# Patient Record
Sex: Male | Born: 1970
Health system: Southern US, Community
[De-identification: ages and names within clinical notes are randomized; demographics above are authoritative.]

## PROBLEM LIST (undated history)

## (undated) HISTORY — PX: APPENDECTOMY: SHX54

---

## 2008-06-11 ENCOUNTER — Emergency Department (HOSPITAL_COMMUNITY): Admission: EM | Admit: 2008-06-11 | Discharge: 2008-06-11 | Payer: Self-pay | Admitting: Emergency Medicine

## 2008-09-14 ENCOUNTER — Emergency Department (HOSPITAL_COMMUNITY): Admission: EM | Admit: 2008-09-14 | Discharge: 2008-09-14 | Payer: Self-pay | Admitting: Emergency Medicine

## 2011-06-19 LAB — COMPREHENSIVE METABOLIC PANEL
ALT: 48
AST: 29
Alkaline Phosphatase: 66
CO2: 26
Chloride: 101
Creatinine, Ser: 1.04
GFR calc Af Amer: >60
GFR calc non Af Amer: >60
Potassium: 3.9
Total Bilirubin: 0.9

## 2011-06-19 LAB — DIFFERENTIAL
Basophils Absolute: 0
Basophils Relative: 0
Eosinophils Absolute: 0.1
Eosinophils Relative: 1
Monocytes Absolute: 0.6

## 2011-06-19 LAB — URINALYSIS, ROUTINE W REFLEX MICROSCOPIC
Bilirubin Urine: NEGATIVE
Hgb urine dipstick: NEGATIVE
Ketones, ur: >80 — AB
Nitrite: NEGATIVE
Urobilinogen, UA: 0.2

## 2011-06-19 LAB — CBC
MCV: 85.9
RBC: 5.06
WBC: 7.1

## 2011-06-19 LAB — LIPASE, BLOOD: Lipase: 22

## 2011-06-23 LAB — COMPREHENSIVE METABOLIC PANEL
AST: 24 U/L (ref 0–37)
BUN: 14 mg/dL (ref 6–23)
CO2: 25 mEq/L (ref 19–32)
Calcium: 9 mg/dL (ref 8.4–10.5)
Chloride: 102 mEq/L (ref 96–112)
Creatinine, Ser: 0.9 mg/dL (ref 0.4–1.5)
GFR calc Af Amer: >60 mL/min (ref >60)
GFR calc non Af Amer: >60 mL/min (ref >60)
Total Bilirubin: 0.7 mg/dL (ref 0.3–1.2)

## 2011-06-23 LAB — URINALYSIS, ROUTINE W REFLEX MICROSCOPIC
Glucose, UA: NEGATIVE mg/dL
Hgb urine dipstick: NEGATIVE
Ketones, ur: NEGATIVE mg/dL
Nitrite: NEGATIVE
Protein, ur: NEGATIVE mg/dL
Specific Gravity, Urine: 1.034 — ABNORMAL HIGH (ref 1.005–1.030)
Urobilinogen, UA: 0.2 mg/dL (ref 0.0–1.0)
pH: 5 (ref 5.0–8.0)

## 2011-06-23 LAB — CBC
HCT: 42.7 % (ref 39.0–52.0)
MCHC: 33.9 g/dL (ref 30.0–36.0)
MCV: 84.8 fL (ref 78.0–100.0)
Platelets: 192 10*3/uL (ref 150–400)
RBC: 5.04 MIL/uL (ref 4.22–5.81)
WBC: 7.4 10*3/uL (ref 4.0–10.5)

## 2011-06-23 LAB — DIFFERENTIAL
Basophils Absolute: 0 10*3/uL (ref 0.0–0.1)
Lymphocytes Relative: 12 % (ref 12–46)
Lymphs Abs: 0.9 10*3/uL (ref 0.7–4.0)
Neutro Abs: 6.2 10*3/uL (ref 1.7–7.7)

## 2011-06-23 LAB — LIPASE, BLOOD: Lipase: 26 U/L (ref 11–59)

## 2016-10-12 ENCOUNTER — Encounter (HOSPITAL_COMMUNITY): Payer: Self-pay | Admitting: Emergency Medicine

## 2016-10-12 ENCOUNTER — Emergency Department (HOSPITAL_COMMUNITY)
Admission: EM | Admit: 2016-10-12 | Discharge: 2016-10-13 | Disposition: A | Discharge status: Home / Self Care | Payer: Self-pay | Attending: Emergency Medicine | Admitting: Emergency Medicine

## 2016-10-12 ENCOUNTER — Emergency Department (HOSPITAL_COMMUNITY): Payer: Self-pay

## 2016-10-12 DIAGNOSIS — Y999 Unspecified external cause status: Secondary | ICD-10-CM | POA: Insufficient documentation

## 2016-10-12 DIAGNOSIS — R059 Cough, unspecified: Secondary | ICD-10-CM

## 2016-10-12 DIAGNOSIS — F17210 Nicotine dependence, cigarettes, uncomplicated: Secondary | ICD-10-CM | POA: Insufficient documentation

## 2016-10-12 DIAGNOSIS — S39011A Strain of muscle, fascia and tendon of abdomen, initial encounter: Secondary | ICD-10-CM | POA: Insufficient documentation

## 2016-10-12 DIAGNOSIS — R05 Cough: Secondary | ICD-10-CM | POA: Insufficient documentation

## 2016-10-12 DIAGNOSIS — Y939 Activity, unspecified: Secondary | ICD-10-CM | POA: Insufficient documentation

## 2016-10-12 DIAGNOSIS — T148XXA Other injury of unspecified body region, initial encounter: Secondary | ICD-10-CM

## 2016-10-12 DIAGNOSIS — Y929 Unspecified place or not applicable: Secondary | ICD-10-CM | POA: Insufficient documentation

## 2016-10-12 DIAGNOSIS — X58XXXA Exposure to other specified factors, initial encounter: Secondary | ICD-10-CM | POA: Insufficient documentation

## 2016-10-12 LAB — CBC
HCT: 44.3 % (ref 39.0–52.0)
Hemoglobin: 14.9 g/dL (ref 13.0–17.0)
MCH: 28.8 pg (ref 26.0–34.0)
MCHC: 33.6 g/dL (ref 30.0–36.0)
MCV: 85.7 fL (ref 78.0–100.0)
PLATELETS: 172 10*3/uL (ref 150–400)
RBC: 5.17 MIL/uL (ref 4.22–5.81)
RDW: 11.7 % (ref 11.5–15.5)
WBC: 4.1 10*3/uL (ref 4.0–10.5)

## 2016-10-12 LAB — BASIC METABOLIC PANEL
Anion gap: 8 (ref 5–15)
BUN: 8 mg/dL (ref 6–20)
CO2: 26 mmol/L (ref 22–32)
Calcium: 9.1 mg/dL (ref 8.9–10.3)
Chloride: 102 mmol/L (ref 101–111)
Creatinine, Ser: 0.67 mg/dL (ref 0.61–1.24)
GFR calc Af Amer: >60 mL/min (ref >60)
GLUCOSE: 129 mg/dL — AB (ref 65–99)
POTASSIUM: 4.1 mmol/L (ref 3.5–5.1)
Sodium: 136 mmol/L (ref 135–145)

## 2016-10-12 NOTE — ED Triage Notes (Signed)
Pt reports productive cough x1 month and right flank pain that began today. Pt denies fever, chills or any urinary symptoms. resp e/u, skin warm and dry.

## 2016-10-13 ENCOUNTER — Emergency Department (HOSPITAL_COMMUNITY): Payer: Self-pay

## 2016-10-13 LAB — URINALYSIS, ROUTINE W REFLEX MICROSCOPIC
BILIRUBIN URINE: NEGATIVE
Glucose, UA: NEGATIVE mg/dL
HGB URINE DIPSTICK: NEGATIVE
KETONES UR: 5 mg/dL — AB
Leukocytes, UA: NEGATIVE
Nitrite: NEGATIVE
PROTEIN: NEGATIVE mg/dL
Specific Gravity, Urine: 1.009 (ref 1.005–1.030)
pH: 5 (ref 5.0–8.0)

## 2016-10-13 MED ORDER — IBUPROFEN 600 MG PO TABS: 600.0000 mg | ORAL_TABLET | Freq: Four times a day (QID) | ORAL | 0 refills | Status: DC | PRN

## 2016-10-13 MED ORDER — CYCLOBENZAPRINE HCL 10 MG PO TABS: 10.0000 mg | ORAL_TABLET | Freq: Two times a day (BID) | ORAL | 0 refills | Status: DC | PRN

## 2016-10-13 MED ORDER — BENZONATATE 100 MG PO CAPS: 200.0000 mg | ORAL_CAPSULE | Freq: Two times a day (BID) | ORAL | 0 refills | Status: DC | PRN

## 2016-10-13 NOTE — ED Provider Notes (Signed)
[facility]-EMERGENCY DEPT Provider Note   CSN: [id number] Arrival date & time: 10/12/16  1800     History   Chief Complaint Chief Complaint  Patient presents with  . Flank Pain  . Cough    HPI Rodney Pittman is a 46 y.o. male.  Patient presents to the emergency department complaining of cough and right flank pain. Patient states that he developed sudden onset right flank pain this afternoon around 4:30 PM. He he states his symptoms started shortly after having a coughing spell. He states the pain now radiates to his right lower abdomen. He denies any associated fever, chills, nausea, vomiting, diarrhea, dysuria, or hematuria. He denies any history of kidney stones. Past surgical history is remarkable for appendectomy. He denies productive cough.   The history is provided by the patient. No language interpreter was used.    History reviewed. No pertinent past medical history.  There are no active problems to display for this patient.   Past Surgical History:  Procedure Laterality Date  . APPENDECTOMY         Home Medications    Prior to Admission medications   Not on File    Family History No family history on file.  Social History Social History  Substance Use Topics  . Smoking status: Current Some Day Smoker    Types: Cigarettes  . Smokeless tobacco: Not on file  . Alcohol use No     Allergies   Patient has no known allergies.   Review of Systems Review of Systems  Respiratory: Positive for cough.   Genitourinary: Positive for flank pain.  Musculoskeletal:       Right rib pain  All other systems reviewed and are negative.    Physical Exam Updated Vital Signs BP 138/78 (BP Location: Right Arm)   Pulse 78   Temp 98.6 F (37 C) (Oral)   Resp 20   Ht 6' (1.829 m)   Wt 81.6 kg   SpO2 99%   BMI 24.41 kg/m   Physical Exam  Constitutional: He is oriented to person, place, and time. He appears well-developed and well-nourished.  HENT:  Head:  Normocephalic and atraumatic.  Eyes: Conjunctivae and EOM are normal. Pupils are equal, round, and reactive to light. Right eye exhibits no discharge. Left eye exhibits no discharge. No scleral icterus.  Neck: Normal range of motion. Neck supple. No JVD present.  Cardiovascular: Normal rate, regular rhythm and normal heart sounds.  Exam reveals no gallop and no friction rub.   No murmur heard. Pulmonary/Chest: Effort normal and breath sounds normal. No respiratory distress. He has no wheezes. He has no rales. He exhibits no tenderness.  Abdominal: Soft. He exhibits no distension and no mass. There is no tenderness. There is no rebound and no guarding.  Musculoskeletal: Normal range of motion. He exhibits no edema or tenderness.  Neurological: He is alert and oriented to person, place, and time.  Skin: Skin is warm and dry.  Psychiatric: He has a normal mood and affect. His behavior is normal. Judgment and thought content normal.  Nursing note and vitals reviewed.    ED Treatments / Results  Labs (all labs ordered are listed, but only abnormal results are displayed) Labs Reviewed  URINALYSIS, ROUTINE W REFLEX MICROSCOPIC - Abnormal; Notable for the following:       Result Value   Ketones, ur 5 (*)    All other components within normal limits  BASIC METABOLIC PANEL - Abnormal; Notable for the following:  Glucose, Bld 129 (*)    All other components within normal limits  CBC    EKG  EKG Interpretation None       Radiology Dg Chest 2 View  Result Date: 10/12/2016 CLINICAL DATA:  Three-week history of productive cough. EXAM: CHEST  2 VIEW COMPARISON:  None. FINDINGS: The heart size and mediastinal contours are within normal limits. Both lungs are clear. The visualized skeletal structures are unremarkable. IMPRESSION: No active cardiopulmonary disease. Electronically Signed   By: Kennith Center M.D.   On: 10/12/2016 20:26    Procedures Procedures (including critical care  time)  Medications Ordered in ED Medications - No data to display   Initial Impression / Assessment and Plan / ED Course  I have reviewed the triage vital signs and the nursing notes.  Pertinent labs & imaging results that were available during my care of the patient were reviewed by me and considered in my medical decision making (see chart for details).     Patient with right flank pain. Onset after coughing today. I'm mostly suspicious of intercostal strain versus rib fracture. Chest x-ray shows no displaced ribs. Patient also has some pain that radiates into his right lower quadrant. He does have a history of appendectomy. Kidney stone is not ruled out.  CT renal study is negative for renal stone. Urinalysis negative for UTI. Will treat for intercostal strain with muscle relaxer, NSAIDs, and cough medicine.  Final Clinical Impressions(s) / ED Diagnoses   Final diagnoses:  Cough  Muscle strain    New Prescriptions New Prescriptions   BENZONATATE (TESSALON) 100 MG CAPSULE    Take 2 capsules (200 mg total) by mouth 2 (two) times daily as needed for cough.   CYCLOBENZAPRINE (FLEXERIL) 10 MG TABLET    Take 1 tablet (10 mg total) by mouth 2 (two) times daily as needed for muscle spasms.   IBUPROFEN (ADVIL,MOTRIN) 600 MG TABLET    Take 1 tablet (600 mg total) by mouth every 6 (six) hours as needed.     Roxy Horseman, PA-C 10/13/16 0129    Dione Booze, MD 10/13/16 [phone]

## 2017-02-25 ENCOUNTER — Emergency Department (HOSPITAL_COMMUNITY)
Admission: EM | Admit: 2017-02-25 | Discharge: 2017-02-25 | Disposition: A | Discharge status: Home / Self Care | Payer: Self-pay | Attending: Emergency Medicine | Admitting: Emergency Medicine

## 2017-02-25 ENCOUNTER — Encounter (HOSPITAL_COMMUNITY): Payer: Self-pay

## 2017-02-25 DIAGNOSIS — F17210 Nicotine dependence, cigarettes, uncomplicated: Secondary | ICD-10-CM | POA: Insufficient documentation

## 2017-02-25 DIAGNOSIS — Y999 Unspecified external cause status: Secondary | ICD-10-CM | POA: Insufficient documentation

## 2017-02-25 DIAGNOSIS — Y929 Unspecified place or not applicable: Secondary | ICD-10-CM | POA: Insufficient documentation

## 2017-02-25 DIAGNOSIS — Y939 Activity, unspecified: Secondary | ICD-10-CM | POA: Insufficient documentation

## 2017-02-25 DIAGNOSIS — Z23 Encounter for immunization: Secondary | ICD-10-CM | POA: Insufficient documentation

## 2017-02-25 DIAGNOSIS — S81812A Laceration without foreign body, left lower leg, initial encounter: Secondary | ICD-10-CM | POA: Insufficient documentation

## 2017-02-25 DIAGNOSIS — W268XXA Contact with other sharp object(s), not elsewhere classified, initial encounter: Secondary | ICD-10-CM | POA: Insufficient documentation

## 2017-02-25 MED ORDER — ACETAMINOPHEN 500 MG PO TABS
1000.0000 mg | ORAL_TABLET | Freq: Once | ORAL | Status: AC
  Administered 2017-02-25: 1000 mg via ORAL
  Filled 2017-02-25: qty 2

## 2017-02-25 MED ORDER — TETANUS-DIPHTH-ACELL PERTUSSIS 5-2.5-18.5 LF-MCG/0.5 IM SUSP
0.5000 mL | Freq: Once | INTRAMUSCULAR | Status: AC
  Administered 2017-02-25: 0.5 mL via INTRAMUSCULAR
  Filled 2017-02-25: qty 0.5

## 2017-02-25 MED ORDER — LIDOCAINE-EPINEPHRINE (PF) 2 %-1:200000 IJ SOLN
10.0000 mL | Freq: Once | INTRAMUSCULAR | Status: AC
  Administered 2017-02-25: 10 mL
  Filled 2017-02-25: qty 20

## 2017-02-25 NOTE — ED Notes (Signed)
Suture cart at pts bedside ?

## 2017-02-25 NOTE — ED Provider Notes (Signed)
[facility]-EMER[facility]Y DEPT Provider Note   CSN: [id number] Arrival date & time: 02/25/17  1625     History   Chief Complaint Chief Complaint  Patient presents with  . Extremity Laceration    HPI Rodney Pittman is a 46 y.o. male.   Laceration   The incident occurred less than 1 hour ago. Pain location: L inner thigh. The laceration is 10 cm in size. Injury mechanism: Ceramic tile. The pain is at a severity of 5/10. The pain is moderate. The pain has been constant since onset. He reports no foreign bodies present. His tetanus status is unknown.    History reviewed. No pertinent past medical history.  There are no active problems to display for this patient.   Past Surgical History:  Procedure Laterality Date  . APPENDECTOMY         Home Medications    Prior to Admission medications   Medication Sig Start Date End Date Taking? Authorizing Provider  benzonatate (TESSALON) 100 MG capsule Take 2 capsules (200 mg total) by mouth 2 (two) times daily as needed for cough. 10/13/16   Roxy HorsemanBrowning, Robert, PA-C  cyclobenzaprine (FLEXERIL) 10 MG tablet Take 1 tablet (10 mg total) by mouth 2 (two) times daily as needed for muscle spasms. 10/13/16   Roxy HorsemanBrowning, Robert, PA-C  ibuprofen (ADVIL,MOTRIN) 600 MG tablet Take 1 tablet (600 mg total) by mouth every 6 (six) hours as needed. 10/13/16   Roxy HorsemanBrowning, Robert, PA-C    Family History No family history on file.  Social History Social History  Substance Use Topics  . Smoking status: Current Some Day Smoker    Types: Cigarettes  . Smokeless tobacco: Never Used  . Alcohol use No     Allergies   Patient has no known allergies.   Review of Systems Review of Systems  Respiratory: Negative for shortness of breath.   Cardiovascular: Negative for chest pain.  Gastrointestinal: Negative for nausea and vomiting.  Musculoskeletal: Negative for gait problem.  Skin: Positive for wound.  Neurological: Negative for weakness and numbness.    Psychiatric/Behavioral: Negative for behavioral problems.     Physical Exam Updated Vital Signs BP 132/74 (BP Location: Left Arm)   Pulse 91   Temp 98.2 F (36.8 C) (Oral)   Resp 18   SpO2 97%   Physical Exam  Constitutional: He appears distressed.  HENT:  Head: Atraumatic.  Eyes: Conjunctivae are normal.  Neck: Normal range of motion.  Cardiovascular: Normal rate.   Pulmonary/Chest: Effort normal.  Abdominal: He exhibits no distension.  Musculoskeletal: Normal range of motion.  No deformity to the left leg. Motor function intact. Sensation intact. +2 DP pulse  Neurological: He is alert.  Skin: Skin is warm.  10 cm laceration to left inner thigh. Hemostatic.   Psychiatric: He has a normal mood and affect.     ED Treatments / Results  Labs (all labs ordered are listed, but only abnormal results are displayed) Labs Reviewed - No data to display  EKG  EKG Interpretation None       Radiology No results found.  Procedures Procedures (including critical care time)  Medications Ordered in ED Medications  Tdap (BOOSTRIX) injection 0.5 mL (0.5 mLs Intramuscular Given 02/25/17 1657)  lidocaine-EPINEPHrine (XYLOCAINE W/EPI) 2 %-1:200000 (PF) injection 10 mL (10 mLs Infiltration Given 02/25/17 1656)  acetaminophen (TYLENOL) tablet 1,000 mg (1,000 mg Oral Given 02/25/17 1746)     Initial Impression / Assessment and Plan / ED Course  I have reviewed the triage vital  signs and the nursing notes.  Pertinent labs & imaging results that were available during my care of the patient were reviewed by me and considered in my medical decision making (see chart for details).    46 year old male in no significant past medical history presents with a left lower leg laceration after dropping ceramic tile. Neurovascularly intact. Bleeding well controlled on arrival. No signs of arterial bleed. Tetanus updated. Wound was washed out, laceration was repaired. See procedure note above  for details. Given NSAIDs for pain.  Discuss follow-up as an outpatient for suture removal in 7 days.Given strict instruction precautions for signs of infection. Patient expressed understanding, questions or concerns at time of discharge.  Final Clinical Impressions(s) / ED Diagnoses   Final diagnoses:  Laceration of left lower extremity, initial encounter    New Prescriptions New Prescriptions   No medications on file     Corena Herter, MD 02/25/17 1754    Derwood Kaplan, MD 02/28/17 1836

## 2017-02-25 NOTE — ED Triage Notes (Signed)
Patient here with laceration to left lower leg after dropping a piece of ceramic on same. Arrived with sheet type tourniquet due to heavy bleeding at home per family. Patient alert and oriented, no bleeding on arrival

## 2017-12-09 ENCOUNTER — Encounter (HOSPITAL_COMMUNITY): Payer: Self-pay | Admitting: Emergency Medicine

## 2017-12-09 ENCOUNTER — Emergency Department (HOSPITAL_COMMUNITY): Payer: Self-pay

## 2017-12-09 ENCOUNTER — Other Ambulatory Visit: Payer: Self-pay

## 2017-12-09 DIAGNOSIS — R079 Chest pain, unspecified: Secondary | ICD-10-CM | POA: Insufficient documentation

## 2017-12-09 DIAGNOSIS — Z5321 Procedure and treatment not carried out due to patient leaving prior to being seen by health care provider: Secondary | ICD-10-CM | POA: Insufficient documentation

## 2017-12-09 LAB — BASIC METABOLIC PANEL
ANION GAP: 10 (ref 5–15)
BUN: 9 mg/dL (ref 6–20)
CALCIUM: 9.2 mg/dL (ref 8.9–10.3)
CHLORIDE: 104 mmol/L (ref 101–111)
CO2: 24 mmol/L (ref 22–32)
Creatinine, Ser: 0.74 mg/dL (ref 0.61–1.24)
GFR calc non Af Amer: >60 mL/min (ref >60)
Glucose, Bld: 148 mg/dL — ABNORMAL HIGH (ref 65–99)
Potassium: 3.7 mmol/L (ref 3.5–5.1)
SODIUM: 138 mmol/L (ref 135–145)

## 2017-12-09 LAB — CBC
HCT: 43.2 % (ref 39.0–52.0)
HEMOGLOBIN: 14.9 g/dL (ref 13.0–17.0)
MCH: 29.6 pg (ref 26.0–34.0)
MCHC: 34.5 g/dL (ref 30.0–36.0)
MCV: 85.7 fL (ref 78.0–100.0)
Platelets: 196 10*3/uL (ref 150–400)
RBC: 5.04 MIL/uL (ref 4.22–5.81)
RDW: 11.7 % (ref 11.5–15.5)
WBC: 6.9 10*3/uL (ref 4.0–10.5)

## 2017-12-09 NOTE — ED Triage Notes (Signed)
Pt reports left sided sharp/pressure pain for an hour.  Pt states no hx of chest pain/cardiac issues.  Pt was dizzy and "seeing stars."  No diaphoresis or emesis however was nauseated.

## 2017-12-10 ENCOUNTER — Emergency Department (HOSPITAL_COMMUNITY)
Admission: EM | Admit: 2017-12-10 | Discharge: 2017-12-10 | Disposition: A | Discharge status: Home / Self Care | Payer: Self-pay | Attending: Emergency Medicine | Admitting: Emergency Medicine

## 2017-12-10 LAB — I-STAT TROPONIN, ED: Troponin i, poc: 0 ng/mL (ref 0.00–0.08)

## 2017-12-10 NOTE — ED Notes (Signed)
Pt has left the lobby. 

## 2018-06-15 IMAGING — DX DG CHEST 2V
2 series · 2 of 2 positions shown · non-contrast
Comparison: 10/12/2016

CLINICAL DATA: Chest pain with shortness of breath.

EXAM:
CHEST - 2 VIEW

[w chest pa]
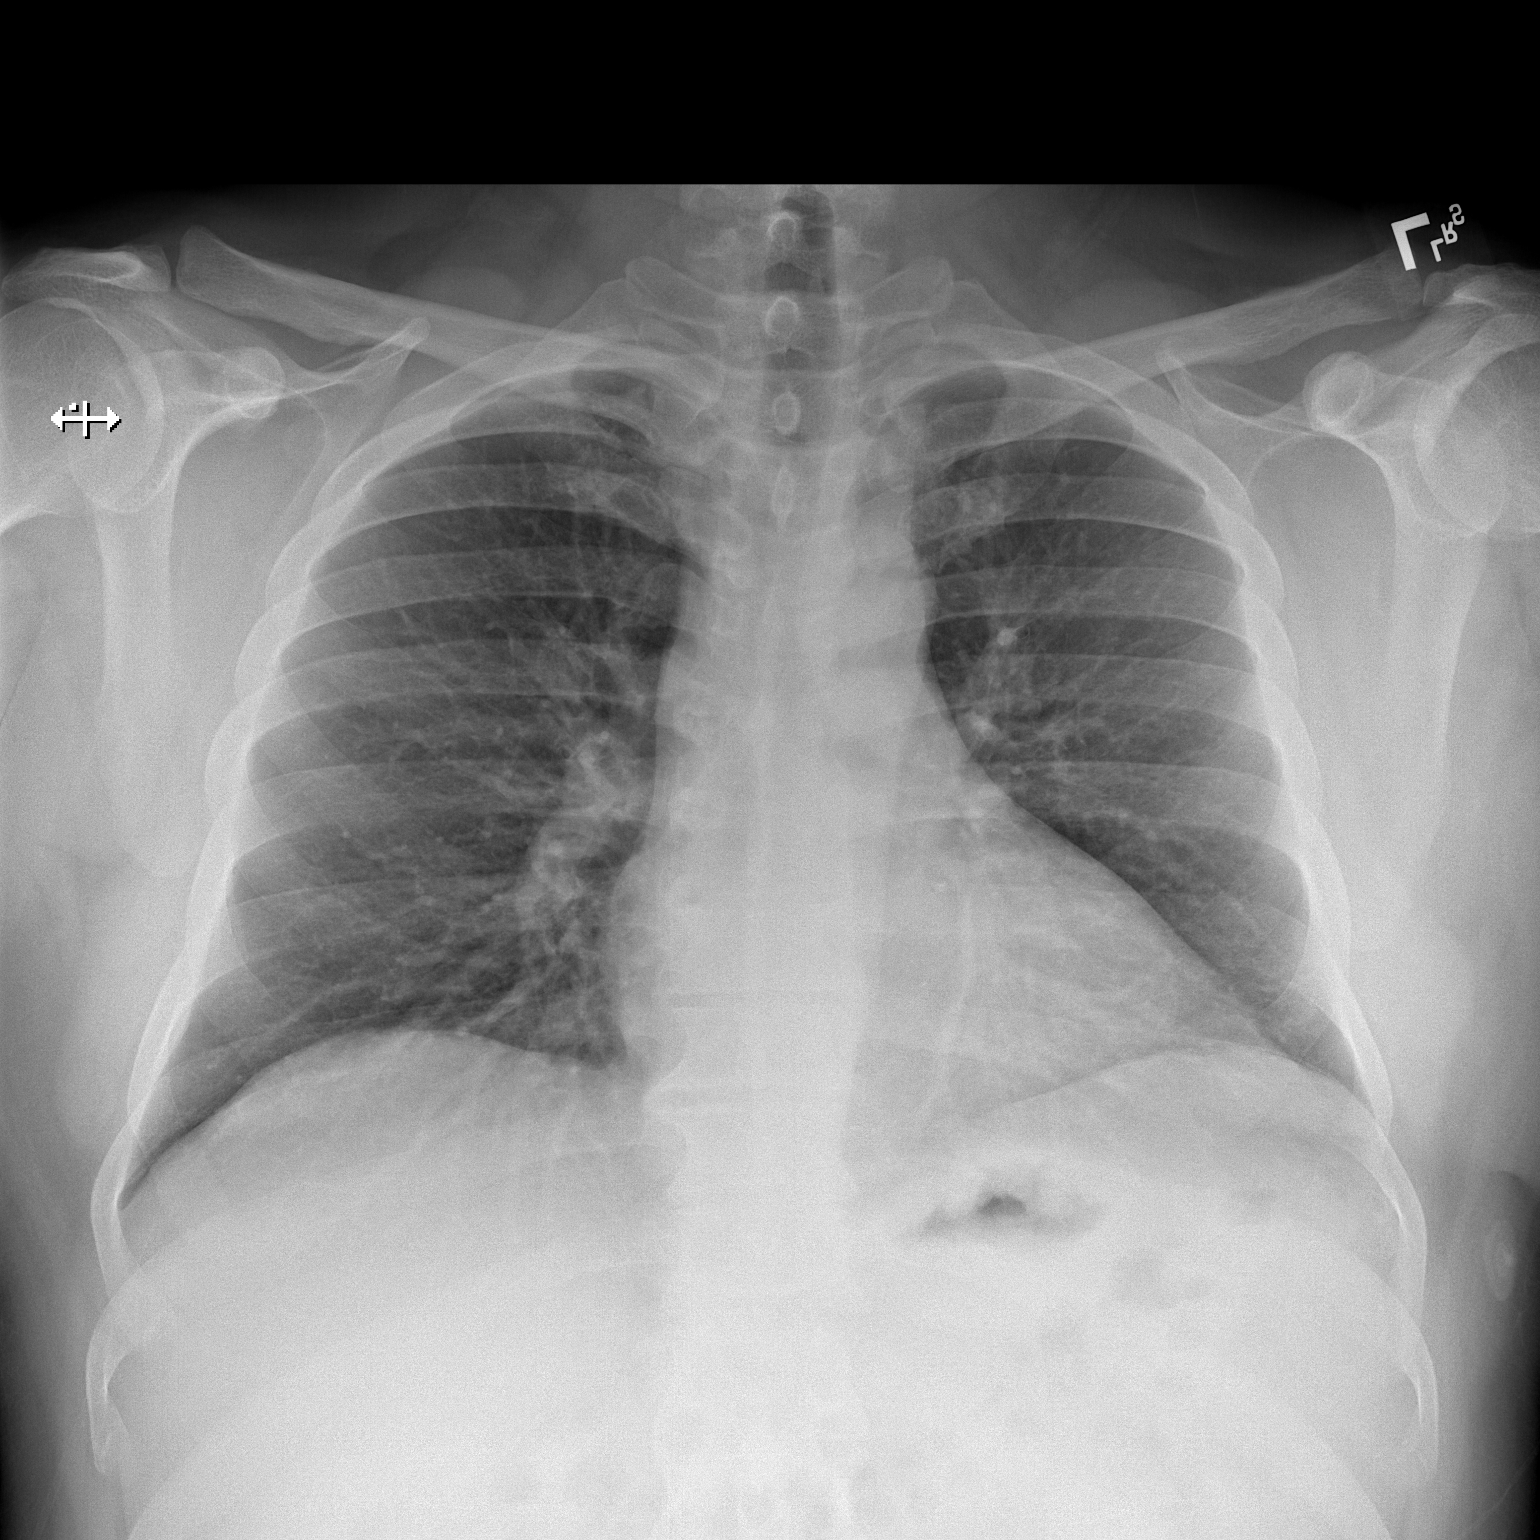

[w chest lat]
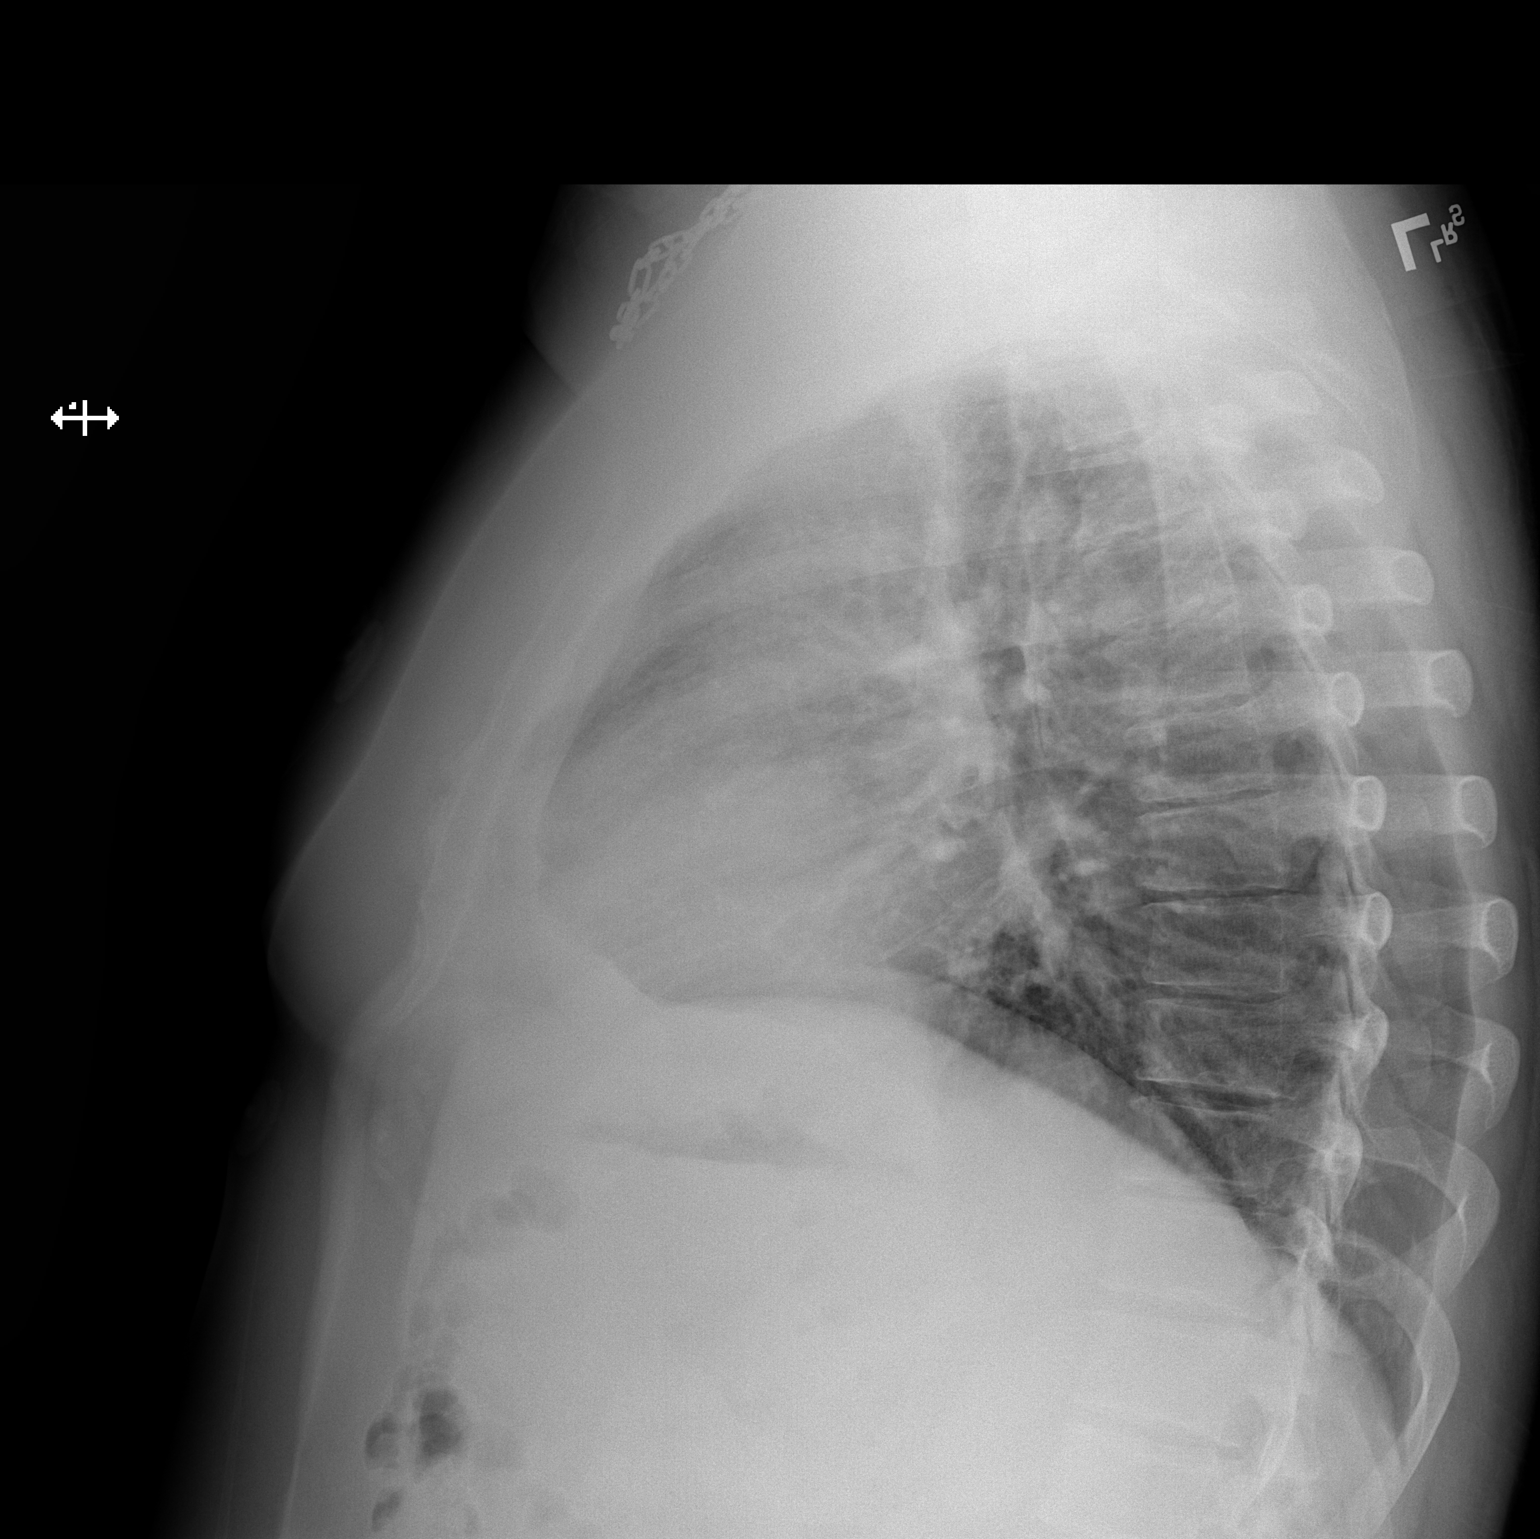

[2 of 2 positions shown; findings below may reference images not displayed]

FINDINGS: The lungs are clear without focal pneumonia, edema, pneumothorax or
pleural effusion. The cardiopericardial silhouette is within normal
limits for size. The visualized bony structures of the thorax are
intact.
IMPRESSION: No active cardiopulmonary disease.

## 2018-08-16 ENCOUNTER — Encounter (HOSPITAL_COMMUNITY): Payer: Self-pay

## 2018-08-16 ENCOUNTER — Emergency Department (HOSPITAL_COMMUNITY)
Admission: EM | Admit: 2018-08-16 | Discharge: 2018-08-16 | Discharge status: Against Medical Advice | Payer: Self-pay | Attending: Emergency Medicine | Admitting: Emergency Medicine

## 2018-08-16 ENCOUNTER — Other Ambulatory Visit: Payer: Self-pay

## 2018-08-16 DIAGNOSIS — R203 Hyperesthesia: Secondary | ICD-10-CM | POA: Insufficient documentation

## 2018-08-16 NOTE — ED Notes (Signed)
Pt called x2, No answer.

## 2018-08-16 NOTE — ED Notes (Signed)
Pt not found in lobby 

## 2018-08-16 NOTE — ED Notes (Signed)
Pt not found in the lobby 

## 2018-08-16 NOTE — ED Notes (Signed)
Pt called for room x1. No answer.  

## 2018-08-16 NOTE — ED Triage Notes (Signed)
Patient c/o feeling sleepy and feeling like ants are crawling on his arms and legs since this AM

## 2019-05-17 ENCOUNTER — Emergency Department (HOSPITAL_COMMUNITY)
Admission: EM | Admit: 2019-05-17 | Discharge: 2019-05-17 | Disposition: A | Discharge status: Home / Self Care | Payer: No Typology Code available for payment source | Attending: Emergency Medicine | Admitting: Emergency Medicine

## 2019-05-17 ENCOUNTER — Encounter (HOSPITAL_COMMUNITY): Payer: Self-pay | Admitting: Obstetrics and Gynecology

## 2019-05-17 ENCOUNTER — Other Ambulatory Visit: Payer: Self-pay

## 2019-05-17 ENCOUNTER — Emergency Department (HOSPITAL_COMMUNITY): Payer: No Typology Code available for payment source

## 2019-05-17 DIAGNOSIS — F17210 Nicotine dependence, cigarettes, uncomplicated: Secondary | ICD-10-CM | POA: Diagnosis not present

## 2019-05-17 DIAGNOSIS — Y9389 Activity, other specified: Secondary | ICD-10-CM | POA: Diagnosis not present

## 2019-05-17 DIAGNOSIS — Y929 Unspecified place or not applicable: Secondary | ICD-10-CM | POA: Insufficient documentation

## 2019-05-17 DIAGNOSIS — Y999 Unspecified external cause status: Secondary | ICD-10-CM | POA: Diagnosis not present

## 2019-05-17 DIAGNOSIS — M25512 Pain in left shoulder: Secondary | ICD-10-CM

## 2019-05-17 DIAGNOSIS — M25562 Pain in left knee: Secondary | ICD-10-CM | POA: Insufficient documentation

## 2019-05-17 MED ORDER — NAPROXEN 500 MG PO TABS: 500.0000 mg | ORAL_TABLET | Freq: Two times a day (BID) | ORAL | 0 refills | Status: AC

## 2019-05-17 MED ORDER — KETOROLAC TROMETHAMINE 30 MG/ML IJ SOLN
30.0000 mg | Freq: Once | INTRAMUSCULAR | Status: AC
  Administered 2019-05-17: 22:00:00 30 mg via INTRAMUSCULAR
  Filled 2019-05-17: qty 1

## 2019-05-17 MED ORDER — METHOCARBAMOL 500 MG PO TABS: 500.0000 mg | ORAL_TABLET | Freq: Two times a day (BID) | ORAL | 0 refills | Status: AC

## 2019-05-17 NOTE — Discharge Instructions (Signed)
Los resultados de sus rayos X fueron negativos hoy.  Le he recetado medicina para ayudar con el dolor y con la inflamacion.   Methocarbamol es una medicina que va ayudar a relajar sus musculos, no tome alcohol con esta medicina ni maneje, ya que puede causar [place].  Naproxen es para la inflamacion, puede tomar dos diarias con comida.
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
## 2019-05-17 NOTE — ED Notes (Signed)
An After Visit Summary was printed and given to the patient. Discharge instructions given and no further questions at this time.  

## 2019-10-21 ENCOUNTER — Ambulatory Visit: Payer: No Typology Code available for payment source | Attending: Internal Medicine

## 2019-10-21 DIAGNOSIS — Z20822 Contact with and (suspected) exposure to covid-19: Secondary | ICD-10-CM

## 2019-10-22 LAB — NOVEL CORONAVIRUS, NAA: SARS-CoV-2, NAA: NOT DETECTED

## 2020-06-07 ENCOUNTER — Emergency Department (HOSPITAL_COMMUNITY): Payer: Self-pay

## 2020-06-07 ENCOUNTER — Emergency Department (HOSPITAL_COMMUNITY): Payer: Self-pay | Admitting: Anesthesiology

## 2020-06-07 ENCOUNTER — Inpatient Hospital Stay (HOSPITAL_COMMUNITY)
Admission: EM | Admit: 2020-06-07 | Discharge: 2020-06-10 | DRG: 480 | Disposition: A | Discharge status: Home Health | Payer: Self-pay | Attending: Orthopedic Surgery | Admitting: Orthopedic Surgery

## 2020-06-07 ENCOUNTER — Encounter (HOSPITAL_COMMUNITY)
Admission: EM | Disposition: A | Discharge status: Home Health | Payer: Self-pay | Source: Home / Self Care | Attending: Orthopedic Surgery

## 2020-06-07 ENCOUNTER — Other Ambulatory Visit: Payer: Self-pay

## 2020-06-07 DIAGNOSIS — Y990 Civilian activity done for income or pay: Secondary | ICD-10-CM

## 2020-06-07 DIAGNOSIS — S82042C Displaced comminuted fracture of left patella, initial encounter for open fracture type IIIA, IIIB, or IIIC: Principal | ICD-10-CM | POA: Diagnosis present

## 2020-06-07 DIAGNOSIS — W1789XA Other fall from one level to another, initial encounter: Secondary | ICD-10-CM

## 2020-06-07 DIAGNOSIS — S72432C Displaced fracture of medial condyle of left femur, initial encounter for open fracture type IIIA, IIIB, or IIIC: Secondary | ICD-10-CM | POA: Diagnosis present

## 2020-06-07 DIAGNOSIS — F17210 Nicotine dependence, cigarettes, uncomplicated: Secondary | ICD-10-CM | POA: Diagnosis present

## 2020-06-07 DIAGNOSIS — W11XXXA Fall on and from ladder, initial encounter: Secondary | ICD-10-CM | POA: Diagnosis present

## 2020-06-07 DIAGNOSIS — S81009A Unspecified open wound, unspecified knee, initial encounter: Secondary | ICD-10-CM

## 2020-06-07 DIAGNOSIS — T1490XA Injury, unspecified, initial encounter: Secondary | ICD-10-CM

## 2020-06-07 DIAGNOSIS — M898X9 Other specified disorders of bone, unspecified site: Secondary | ICD-10-CM | POA: Diagnosis present

## 2020-06-07 DIAGNOSIS — S40811A Abrasion of right upper arm, initial encounter: Secondary | ICD-10-CM | POA: Diagnosis present

## 2020-06-07 DIAGNOSIS — E119 Type 2 diabetes mellitus without complications: Secondary | ICD-10-CM | POA: Diagnosis present

## 2020-06-07 DIAGNOSIS — S82042B Displaced comminuted fracture of left patella, initial encounter for open fracture type I or II: Secondary | ICD-10-CM | POA: Diagnosis present

## 2020-06-07 DIAGNOSIS — Z419 Encounter for procedure for purposes other than remedying health state, unspecified: Secondary | ICD-10-CM

## 2020-06-07 DIAGNOSIS — F17200 Nicotine dependence, unspecified, uncomplicated: Secondary | ICD-10-CM | POA: Diagnosis present

## 2020-06-07 DIAGNOSIS — Z20822 Contact with and (suspected) exposure to covid-19: Secondary | ICD-10-CM | POA: Diagnosis present

## 2020-06-07 DIAGNOSIS — S8261XA Displaced fracture of lateral malleolus of right fibula, initial encounter for closed fracture: Secondary | ICD-10-CM | POA: Diagnosis present

## 2020-06-07 DIAGNOSIS — W19XXXA Unspecified fall, initial encounter: Secondary | ICD-10-CM

## 2020-06-07 DIAGNOSIS — S82092C Other fracture of left patella, initial encounter for open fracture type IIIA, IIIB, or IIIC: Secondary | ICD-10-CM

## 2020-06-07 DIAGNOSIS — T148XXA Other injury of unspecified body region, initial encounter: Secondary | ICD-10-CM

## 2020-06-07 DIAGNOSIS — E139 Other specified diabetes mellitus without complications: Secondary | ICD-10-CM

## 2020-06-07 DIAGNOSIS — D62 Acute posthemorrhagic anemia: Secondary | ICD-10-CM | POA: Diagnosis not present

## 2020-06-07 DIAGNOSIS — Y9289 Other specified places as the place of occurrence of the external cause: Secondary | ICD-10-CM

## 2020-06-07 DIAGNOSIS — E0800 Diabetes mellitus due to underlying condition with hyperosmolarity without nonketotic hyperglycemic-hyperosmolar coma (NKHHC): Secondary | ICD-10-CM

## 2020-06-07 DIAGNOSIS — Y9389 Activity, other specified: Secondary | ICD-10-CM

## 2020-06-07 HISTORY — PX: IRRIGATION AND DEBRIDEMENT KNEE: SHX5185

## 2020-06-07 HISTORY — PX: ORIF PATELLA: SHX5033

## 2020-06-07 HISTORY — PX: APPLICATION OF WOUND VAC: SHX5189

## 2020-06-07 HISTORY — PX: I & D EXTREMITY: SHX5045

## 2020-06-07 HISTORY — DX: Unspecified fall, initial encounter: W19.XXXA

## 2020-06-07 LAB — COMPREHENSIVE METABOLIC PANEL
ALT: 46 U/L — ABNORMAL HIGH (ref 0–44)
AST: 26 U/L (ref 15–41)
Albumin: 3.6 g/dL (ref 3.5–5.0)
Alkaline Phosphatase: 54 U/L (ref 38–126)
Anion gap: 9 (ref 5–15)
BUN: 9 mg/dL (ref 6–20)
CO2: 26 mmol/L (ref 22–32)
Calcium: 8.9 mg/dL (ref 8.9–10.3)
Chloride: 104 mmol/L (ref 98–111)
Creatinine, Ser: 0.84 mg/dL (ref 0.61–1.24)
GFR calc Af Amer: >60 mL/min (ref >60)
GFR calc non Af Amer: >60 mL/min (ref >60)
Glucose, Bld: 161 mg/dL — ABNORMAL HIGH (ref 70–99)
Potassium: 3.8 mmol/L (ref 3.5–5.1)
Sodium: 139 mmol/L (ref 135–145)
Total Bilirubin: 0.6 mg/dL (ref 0.3–1.2)
Total Protein: 6.8 g/dL (ref 6.5–8.1)

## 2020-06-07 LAB — I-STAT CHEM 8, ED
BUN: 11 mg/dL (ref 6–20)
Calcium, Ion: 1.22 mmol/L (ref 1.15–1.40)
Chloride: 102 mmol/L (ref 98–111)
Creatinine, Ser: 0.7 mg/dL (ref 0.61–1.24)
Glucose, Bld: 155 mg/dL — ABNORMAL HIGH (ref 70–99)
HCT: 39 % (ref 39.0–52.0)
Hemoglobin: 13.3 g/dL (ref 13.0–17.0)
Potassium: 3.8 mmol/L (ref 3.5–5.1)
Sodium: 141 mmol/L (ref 135–145)
TCO2: 25 mmol/L (ref 22–32)

## 2020-06-07 LAB — CBC
HCT: 41.7 % (ref 39.0–52.0)
Hemoglobin: 13.6 g/dL (ref 13.0–17.0)
MCH: 28.2 pg (ref 26.0–34.0)
MCHC: 32.6 g/dL (ref 30.0–36.0)
MCV: 86.5 fL (ref 80.0–100.0)
Platelets: 207 10*3/uL (ref 150–400)
RBC: 4.82 MIL/uL (ref 4.22–5.81)
RDW: 11.1 % — ABNORMAL LOW (ref 11.5–15.5)
WBC: 8.3 10*3/uL (ref 4.0–10.5)
nRBC: 0 % (ref 0.0–0.2)

## 2020-06-07 LAB — SARS CORONAVIRUS 2 BY RT PCR (HOSPITAL ORDER, PERFORMED IN ~~LOC~~ HOSPITAL LAB): SARS Coronavirus 2: NEGATIVE

## 2020-06-07 LAB — SAMPLE TO BLOOD BANK

## 2020-06-07 LAB — PROTIME-INR
INR: 1 (ref 0.8–1.2)
Prothrombin Time: 13.2 seconds (ref 11.4–15.2)

## 2020-06-07 LAB — LACTIC ACID, PLASMA: Lactic Acid, Venous: 2 mmol/L (ref 0.5–1.9)

## 2020-06-07 LAB — ETHANOL: Alcohol, Ethyl (B): <10 mg/dL (ref <10)

## 2020-06-07 SURGERY — IRRIGATION AND DEBRIDEMENT EXTREMITY
Anesthesia: General | Site: Knee | Laterality: Left

## 2020-06-07 MED ORDER — ONDANSETRON HCL 4 MG/2ML IJ SOLN
INTRAMUSCULAR | Status: DC | PRN
  Administered 2020-06-07: 4 mg via INTRAVENOUS

## 2020-06-07 MED ORDER — ACETAMINOPHEN 10 MG/ML IV SOLN
INTRAVENOUS | Status: AC
  Filled 2020-06-07: qty 100

## 2020-06-07 MED ORDER — FENTANYL CITRATE (PF) 250 MCG/5ML IJ SOLN
INTRAMUSCULAR | Status: AC
  Filled 2020-06-07: qty 5

## 2020-06-07 MED ORDER — LACTATED RINGERS IV SOLN: INTRAVENOUS | Status: DC | PRN

## 2020-06-07 MED ORDER — ROCURONIUM BROMIDE 100 MG/10ML IV SOLN
INTRAVENOUS | Status: DC | PRN
  Administered 2020-06-07: 20 mg via INTRAVENOUS

## 2020-06-07 MED ORDER — IOHEXOL 300 MG/ML  SOLN
100.0000 mL | Freq: Once | INTRAMUSCULAR | Status: AC | PRN
  Administered 2020-06-07: 100 mL via INTRAVENOUS

## 2020-06-07 MED ORDER — LIDOCAINE 2% (20 MG/ML) 5 ML SYRINGE
INTRAMUSCULAR | Status: AC
  Filled 2020-06-07: qty 5

## 2020-06-07 MED ORDER — ROCURONIUM BROMIDE 10 MG/ML (PF) SYRINGE
PREFILLED_SYRINGE | INTRAVENOUS | Status: AC
  Filled 2020-06-07: qty 10

## 2020-06-07 MED ORDER — PROPOFOL 10 MG/ML IV BOLUS
INTRAVENOUS | Status: DC | PRN
  Administered 2020-06-07: 200 mg via INTRAVENOUS

## 2020-06-07 MED ORDER — ACETAMINOPHEN 10 MG/ML IV SOLN
INTRAVENOUS | Status: DC | PRN
  Administered 2020-06-07: 1000 mg via INTRAVENOUS

## 2020-06-07 MED ORDER — PHENYLEPHRINE 40 MCG/ML (10ML) SYRINGE FOR IV PUSH (FOR BLOOD PRESSURE SUPPORT)
PREFILLED_SYRINGE | INTRAVENOUS | Status: AC
  Filled 2020-06-07: qty 10

## 2020-06-07 MED ORDER — VANCOMYCIN HCL IN DEXTROSE 1-5 GM/200ML-% IV SOLN
1000.0000 mg | Freq: Once | INTRAVENOUS | Status: AC
  Administered 2020-06-07: 1000 mg via INTRAVENOUS
  Filled 2020-06-07: qty 200

## 2020-06-07 MED ORDER — ALBUMIN HUMAN 5 % IV SOLN: INTRAVENOUS | Status: DC | PRN

## 2020-06-07 MED ORDER — PROPOFOL 10 MG/ML IV BOLUS
INTRAVENOUS | Status: AC
  Filled 2020-06-07: qty 20

## 2020-06-07 MED ORDER — SUCCINYLCHOLINE CHLORIDE 20 MG/ML IJ SOLN
INTRAMUSCULAR | Status: DC | PRN
  Administered 2020-06-07: 100 mg via INTRAVENOUS

## 2020-06-07 MED ORDER — DEXAMETHASONE SODIUM PHOSPHATE 10 MG/ML IJ SOLN
INTRAMUSCULAR | Status: AC
  Filled 2020-06-07: qty 1

## 2020-06-07 MED ORDER — SODIUM CHLORIDE 0.9 % IV SOLN
2.0000 g | Freq: Once | INTRAVENOUS | Status: AC
  Administered 2020-06-07: 2 g via INTRAVENOUS
  Filled 2020-06-07: qty 20

## 2020-06-07 MED ORDER — SUCCINYLCHOLINE CHLORIDE 200 MG/10ML IV SOSY
PREFILLED_SYRINGE | INTRAVENOUS | Status: AC
  Filled 2020-06-07: qty 10

## 2020-06-07 MED ORDER — SODIUM CHLORIDE 0.9 % IR SOLN
Status: DC | PRN
  Administered 2020-06-07: 9000 mL

## 2020-06-07 MED ORDER — MIDAZOLAM HCL 5 MG/5ML IJ SOLN
INTRAMUSCULAR | Status: DC | PRN
  Administered 2020-06-07: 2 mg via INTRAVENOUS

## 2020-06-07 MED ORDER — MIDAZOLAM HCL 2 MG/2ML IJ SOLN
INTRAMUSCULAR | Status: AC
  Filled 2020-06-07: qty 2

## 2020-06-07 MED ORDER — ONDANSETRON HCL 4 MG/2ML IJ SOLN
INTRAMUSCULAR | Status: AC
  Filled 2020-06-07: qty 2

## 2020-06-07 MED ORDER — PHENYLEPHRINE HCL (PRESSORS) 10 MG/ML IV SOLN
INTRAVENOUS | Status: DC | PRN
  Administered 2020-06-07 (×2): 40 ug via INTRAVENOUS

## 2020-06-07 MED ORDER — LIDOCAINE HCL (CARDIAC) PF 100 MG/5ML IV SOSY
PREFILLED_SYRINGE | INTRAVENOUS | Status: DC | PRN
  Administered 2020-06-07: 60 mg via INTRATRACHEAL

## 2020-06-07 MED ORDER — 0.9 % SODIUM CHLORIDE (POUR BTL) OPTIME
TOPICAL | Status: DC | PRN
  Administered 2020-06-07: 1000 mL

## 2020-06-07 MED ORDER — MORPHINE SULFATE (PF) 4 MG/ML IV SOLN
4.0000 mg | Freq: Once | INTRAVENOUS | Status: AC
  Administered 2020-06-07: 4 mg via INTRAVENOUS
  Filled 2020-06-07: qty 1

## 2020-06-07 MED ORDER — FENTANYL CITRATE (PF) 250 MCG/5ML IJ SOLN
INTRAMUSCULAR | Status: DC | PRN
  Administered 2020-06-07 (×2): 50 ug via INTRAVENOUS
  Administered 2020-06-07: 100 ug via INTRAVENOUS
  Administered 2020-06-07: 50 ug via INTRAVENOUS
  Administered 2020-06-07: 100 ug via INTRAVENOUS

## 2020-06-07 MED ORDER — EPHEDRINE 5 MG/ML INJ
INTRAVENOUS | Status: AC
  Filled 2020-06-07: qty 10

## 2020-06-07 SURGICAL SUPPLY — 61 items
BIT DRILL 2.4 AO COUPLING CANN (BIT) ×4 IMPLANT
BNDG COHESIVE 4X5 TAN STRL (GAUZE/BANDAGES/DRESSINGS) ×4 IMPLANT
BNDG ELASTIC 4X5.8 VLCR STR LF (GAUZE/BANDAGES/DRESSINGS) ×4 IMPLANT
BNDG ELASTIC 6X10 VLCR STRL LF (GAUZE/BANDAGES/DRESSINGS) ×4 IMPLANT
BNDG GAUZE ELAST 4 BULKY (GAUZE/BANDAGES/DRESSINGS) ×4 IMPLANT
BRUSH SCRUB EZ PLAIN DRY (MISCELLANEOUS) ×8 IMPLANT
CANISTER WOUND CARE 500ML ATS (WOUND CARE) ×4 IMPLANT
COVER SURGICAL LIGHT HANDLE (MISCELLANEOUS) ×8 IMPLANT
COVER WAND RF STERILE (DRAPES) IMPLANT
DRAIN PENROSE 1/4X12 LTX STRL (WOUND CARE) ×4 IMPLANT
DRAPE U-SHAPE 47X51 STRL (DRAPES) ×4 IMPLANT
DRSG ADAPTIC 3X8 NADH LF (GAUZE/BANDAGES/DRESSINGS) ×4 IMPLANT
DRSG MEPILEX BORDER 4X8 (GAUZE/BANDAGES/DRESSINGS) ×4 IMPLANT
DRSG VAC ATS MED SENSATRAC (GAUZE/BANDAGES/DRESSINGS) ×4 IMPLANT
ELECT REM PT RETURN 9FT ADLT (ELECTROSURGICAL)
ELECTRODE REM PT RTRN 9FT ADLT (ELECTROSURGICAL) IMPLANT
GAUZE SPONGE 4X4 12PLY STRL (GAUZE/BANDAGES/DRESSINGS) ×4 IMPLANT
GLOVE BIO SURGEON STRL SZ7.5 (GLOVE) ×4 IMPLANT
GLOVE BIO SURGEON STRL SZ8 (GLOVE) ×4 IMPLANT
GLOVE BIOGEL PI IND STRL 7.5 (GLOVE) ×2 IMPLANT
GLOVE BIOGEL PI IND STRL 8 (GLOVE) ×2 IMPLANT
GLOVE BIOGEL PI IND STRL 9 (GLOVE) ×2 IMPLANT
GLOVE BIOGEL PI INDICATOR 7.5 (GLOVE) ×2
GLOVE BIOGEL PI INDICATOR 8 (GLOVE) ×2
GLOVE BIOGEL PI INDICATOR 9 (GLOVE) ×2
GOWN STRL REUS W/ TWL LRG LVL3 (GOWN DISPOSABLE) ×4 IMPLANT
GOWN STRL REUS W/ TWL XL LVL3 (GOWN DISPOSABLE) ×2 IMPLANT
GOWN STRL REUS W/TWL LRG LVL3 (GOWN DISPOSABLE) ×4
GOWN STRL REUS W/TWL XL LVL3 (GOWN DISPOSABLE) ×2
HANDPIECE INTERPULSE COAX TIP (DISPOSABLE)
IMMOBILIZER KNEE 22 UNIV (SOFTGOODS) ×4 IMPLANT
K-WIRE TROC 1.25X150 (WIRE) ×12
KIT BASIN OR (CUSTOM PROCEDURE TRAY) ×4 IMPLANT
KIT TURNOVER KIT B (KITS) ×4 IMPLANT
KWIRE TROC 1.25X150 (WIRE) ×6 IMPLANT
MANIFOLD NEPTUNE II (INSTRUMENTS) ×4 IMPLANT
NS IRRIG 1000ML POUR BTL (IV SOLUTION) ×4 IMPLANT
PACK ORTHO EXTREMITY (CUSTOM PROCEDURE TRAY) ×4 IMPLANT
PAD ARMBOARD 7.5X6 YLW CONV (MISCELLANEOUS) ×8 IMPLANT
PADDING CAST COTTON 6X4 STRL (CAST SUPPLIES) ×4 IMPLANT
RETRIEVER SUT HEWSON (MISCELLANEOUS) ×4 IMPLANT
SCREW CANN PT 4.0X34 (Screw) ×8 IMPLANT
SET HNDPC FAN SPRY TIP SCT (DISPOSABLE) IMPLANT
SOL PREP POV-IOD 4OZ 10% (MISCELLANEOUS) ×4 IMPLANT
SOL PREP PROV IODINE SCRUB 4OZ (MISCELLANEOUS) ×4 IMPLANT
SPONGE LAP 18X18 RF (DISPOSABLE) ×4 IMPLANT
STOCKINETTE IMPERVIOUS 9X36 MD (GAUZE/BANDAGES/DRESSINGS) IMPLANT
SUT ETHILON 2 0 PSLX (SUTURE) ×8 IMPLANT
SUT FIBERWIRE #2 38 T-5 BLUE (SUTURE) ×12
SUT PDS AB 1 CT1 36 (SUTURE) ×8 IMPLANT
SUT PDS AB 2-0 CT1 27 (SUTURE) ×4 IMPLANT
SUT PROLENE 3 0 PS 1 (SUTURE) ×12 IMPLANT
SUTURE FIBERWR #2 38 T-5 BLUE (SUTURE) ×6 IMPLANT
SYR BULB IRRIG 60ML STRL (SYRINGE) ×4 IMPLANT
TOWEL GREEN STERILE (TOWEL DISPOSABLE) ×8 IMPLANT
TOWEL GREEN STERILE FF (TOWEL DISPOSABLE) ×4 IMPLANT
TUBE CONNECTING 12'X1/4 (SUCTIONS) ×1
TUBE CONNECTING 12X1/4 (SUCTIONS) ×3 IMPLANT
UNDERPAD 30X36 HEAVY ABSORB (UNDERPADS AND DIAPERS) ×4 IMPLANT
WATER STERILE IRR 1000ML POUR (IV SOLUTION) ×4 IMPLANT
YANKAUER SUCT BULB TIP NO VENT (SUCTIONS) ×4 IMPLANT
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
## 2020-06-07 NOTE — ED Notes (Signed)
Critical value received from lab Lactic acid of 2.0. Shanda Bumps RN notified

## 2020-06-07 NOTE — ED Provider Notes (Signed)
[facility] EMERGENCY DEPARTMENT Provider Note   CSN: [id number] Arrival date & time: 06/07/20  1637     History No chief complaint on file.   Rodney Pittman is a 49 y.o. male.  He is a level 2 trauma.  He was working on a billboard standing on a ladder 20 feet up in the air when the wind blew and the patient fell approximately 20 feet to the ground striking his left knee on a hollow pipe that was sticking out.  Open injury to left knee.  Also complaining of right ankle pain.  Pain is 5 out of 10 after EMS gave him some pain medicine.  Reportedly significant blood loss at scene although no active bleeding.  Denies any head neck chest or abdominal pain.  On no blood thinners.  The history is provided by the patient and the EMS personnel. The history is limited by a language barrier. A language interpreter was used (ipad).  Trauma Mechanism of injury: fall Injury location: leg Injury location detail: L knee and R ankle Incident location: outdoors Time since incident: 50 minutes Arrived directly from scene: yes   Fall:      Fall occurred: jumping from height      Height of fall: 20      Point of impact: knees      Entrapped after fall: no      Suspicion of alcohol use: no      Suspicion of drug use: no  EMS/PTA data:      Bystander interventions: splinting      Ambulatory at scene: no      Blood loss: moderate      Responsiveness: alert      Oriented to: person, place, situation and time      Loss of consciousness: no      Amnesic to event: no      Airway interventions: none      Breathing interventions: none      IV access: established      Fluids administered: normal saline      Cardiac interventions: none      Medications administered: none      Immobilization: C-collar and LLE splint      Airway condition since incident: stable      Breathing condition since incident: stable      Circulation condition since incident: stable      Mental status  condition since incident: stable      Disability condition since incident: stable  Current symptoms:      Pain scale: 5/10      Pain quality: throbbing      Pain timing: constant      Associated symptoms:            Denies abdominal pain, back pain, chest pain, headache and loss of consciousness.   Relevant PMH:      Pharmacological risk factors:            No anticoagulation therapy.       Tetanus status: UTD      History reviewed. No pertinent past medical history.  Patient Active Problem List   Diagnosis Date Noted  . Open comminuted fracture of left patella 06/08/2020  . Fall 06/07/2020    History reviewed. No pertinent surgical history.     No family history on file.  Social History   Tobacco Use  . Smoking status: Not on file  Substance Use Topics  . Alcohol use:  Not on file  . Drug use: Not on file    Home Medications Prior to Admission medications   Not on File    Allergies    Patient has no known allergies.  Review of Systems   Review of Systems  Constitutional: Negative for fever.  HENT: Negative for sore throat.   Eyes: Negative for visual disturbance.  Respiratory: Negative for shortness of breath.   Cardiovascular: Negative for chest pain.  Gastrointestinal: Negative for abdominal pain.  Genitourinary: Negative for dysuria.  Musculoskeletal: Negative for back pain.  Skin: Positive for wound. Negative for rash.  Neurological: Negative for loss of consciousness and headaches.    Physical Exam Updated Vital Signs BP 140/75 (BP Location: Right Arm)   Pulse 63   Temp 98.6 F (37 C) (Oral)   Resp 14   SpO2 97%   Physical Exam Vitals and nursing note reviewed.  Constitutional:      Appearance: Normal appearance. He is well-developed.  HENT:     Head: Normocephalic and atraumatic.  Eyes:     Conjunctiva/sclera: Conjunctivae normal.  Neck:     Comments: Cervical collar in place trach midline normal voice Cardiovascular:     Rate  and Rhythm: Normal rate and regular rhythm.     Heart sounds: No murmur heard.   Pulmonary:     Effort: Pulmonary effort is normal. No respiratory distress.     Breath sounds: Normal breath sounds.  Abdominal:     Palpations: Abdomen is soft.     Tenderness: There is no abdominal tenderness.  Musculoskeletal:        General: Tenderness, deformity and signs of injury present.     Comments: He has some tenderness around his right ankle.  Normal landmarks distal pulses intact.  Nontender right knee and hip.  Right upper and left upper extremity full range of motion without any pain or limitations.  Abrasion right arm.  Left lower extremity large defect at his knee approximately 20 cm probable open joint distal ankle and foot nontender.  Distal pulses sensation motor intact.  Cervical thoracic and lumbar no midline tenderness.  No step-offs.  Skin:    General: Skin is warm and dry.     Capillary Refill: Capillary refill takes less than 2 seconds.  Neurological:     General: No focal deficit present.     Mental Status: He is alert.         ED Results / Procedures / Treatments   Labs (all labs ordered are listed, but only abnormal results are displayed) Labs Reviewed  COMPREHENSIVE METABOLIC PANEL - Abnormal; Notable for the following components:      Result Value   Glucose, Bld 161 (*)    ALT 46 (*)    All other components within normal limits  CBC - Abnormal; Notable for the following components:   RDW 11.1 (*)    All other components within normal limits  LACTIC ACID, PLASMA - Abnormal; Notable for the following components:   Lactic Acid, Venous 2.0 (*)    All other components within normal limits  I-STAT CHEM 8, ED - Abnormal; Notable for the following components:   Glucose, Bld 155 (*)    All other components within normal limits  SARS CORONAVIRUS 2 (HOSPITAL ORDER, PERFORMED IN [facility])  ETHANOL  PROTIME-INR  URINALYSIS, ROUTINE W REFLEX MICROSCOPIC    I-STAT CHEM 8, ED  SAMPLE TO BLOOD BANK    EKG None  Radiology DG Knee  1-2 Views Left  Result Date: 06/08/2020 CLINICAL DATA:  Left patella ORIF EXAM: DG C-ARM 1-60 MIN; LEFT KNEE - 1-2 VIEW CONTRAST:  None FLUOROSCOPY TIME:  Fluoroscopy Time: Not listed, refer to operative report Radiation Exposure Index (if provided by the fluoroscopic device): Not listed, refer to operative report Number of Acquired Spot Images: 4 COMPARISON:  06/07/2020 FINDINGS: Four fluoroscopic intraoperative radiographs demonstrate open reduction and internal fixation of oblique patellar fracture utilizing 2 partially threaded screws. Fracture fragments are in anatomic alignment on this limited examination. No unexpected fracture or dislocation. IMPRESSION: Left patellar ORIF as described above. Electronically Signed   By: Helyn Numbers MD   On: 06/08/2020 00:51   DG Ankle Complete Right  Result Date: 06/07/2020 CLINICAL DATA:  Status post fall. EXAM: RIGHT ANKLE - COMPLETE 3+ VIEW COMPARISON:  June 07, 2020 (4:51 p.m.) FINDINGS: A 2 mm fracture fragment is seen adjacent to the distal tip of the right lateral malleolus. There is no evidence of dislocation. There is no evidence of arthropathy or other focal bone abnormality. Mild anterior and lateral soft tissue swelling is seen. IMPRESSION: Very small fracture involving the distal tip of the right lateral malleolus. Electronically Signed   By: Aram Candela M.D.   On: 06/07/2020 19:30   CT HEAD WO CONTRAST  Result Date: 06/07/2020 CLINICAL DATA:  Fell 20 feet from ladder EXAM: CT HEAD WITHOUT CONTRAST TECHNIQUE: Contiguous axial images were obtained from the base of the skull through the vertex without intravenous contrast. COMPARISON:  None. FINDINGS: Brain: No acute infarct or hemorrhage. Lateral ventricles and midline structures are unremarkable. No acute extra-axial fluid collections. No mass effect. Vascular: No hyperdense vessel or unexpected  calcification. Skull: No acute displaced fracture. Sinuses/Orbits: Polypoid mucosal thickening bilateral maxillary sinuses, left greater than right. Other: None. IMPRESSION: 1. No acute intracranial process. Electronically Signed   By: Sharlet Salina M.D.   On: 06/07/2020 18:33   CT CHEST W CONTRAST  Result Date: 06/07/2020 CLINICAL DATA:  Abdominal trauma, 20 foot fall from ladder. EXAM: CT CHEST WITH CONTRAST CT ABDOMEN WITHOUT AND WITH CONTRAST TECHNIQUE: Multidetector CT imaging of the abdomen was performed without intravenous contrast. Multidetector CT imaging of the chest and abdomen was then performed during bolus administration of intravenous contrast. CONTRAST:  OMNIPAQUE IOHEXOL 300 MG/ML  SOLN COMPARISON:  None. FINDINGS: CHEST: Ports and Devices: None. Lungs/airways: Bilateral lower lobe subsegmental atelectasis. Lingular linear atelectasis. No focal consolidation. Few scattered pulmonary micro nodules. No pulmonary mass. No pulmonary contusion or laceration. No pneumatocele formation. The central airways are patent. Pleura: No pleural effusion. No pneumothorax. No hemothorax. Lymph Nodes: No mediastinal, hilar, or axillary lymphadenopathy. Mediastinum: No pneumomediastinum. No aortic injury or mediastinal hematoma. The thoracic aorta is normal in caliber. The heart is normal in size. No significant pericardial effusion. The esophagus is unremarkable. The thyroid is unremarkable. Chest Wall / Breasts: No chest wall mass. Musculoskeletal: No acute rib or sternal fracture. Sternal anatomy variant noted on the lateral view. No spinal fracture. Multilevel mild degenerative changes of the spine. ABDOMEN / PELVIS: Liver: Not enlarged. No focal lesion. No laceration or subcapsular hematoma. Biliary System: Calcified gallstones within the gallbladder lumen. No biliary ductal dilatation. Pancreas: Normal pancreatic contour. No main pancreatic duct dilatation. Spleen: Not enlarged. No focal lesion. No  laceration, subcapsular hematoma, or vascular injury. Adrenal Glands: No nodularity bilaterally. Kidneys: Bilateral kidneys enhance symmetrically. No hydronephrosis. No contusion, laceration, or subcapsular hematoma. No injury to the vascular structures or collecting systems.  No hydroureter. The urinary bladder is unremarkable. On delayed imaging, there is no urothelial wall thickening and there are no filling defects in the opacified portions of the bilateral collecting systems or ureters. Bowel: No small or large bowel wall thickening or dilatation. The appendix is unremarkable. Mesentery, Omentum, and Peritoneum: No simple free fluid ascites. No pneumoperitoneum. No hemoperitoneum. No mesenteric hematoma identified. No organized fluid collection. Pelvic Organs: Normal. Lymph Nodes: Several prominent but not enlarged retroperitoneal lymph nodes are nonspecific. No abdominal, pelvic, inguinal lymphadenopathy. Vasculature: No abdominal aorta or iliac aneurysm. No active contrast extravasation or pseudoaneurysm. Mild calcified and noncalcified atherosclerotic plaque of the aorta and its branches. Musculoskeletal: Tiny fat containing umbilical hernia. No significant soft tissue hematoma. No acute pelvic fracture. No spinal fracture. Multilevel mild degenerative changes of the spine. Coccyx is anteriorly angulated which may be due to an old healed fracture versus a congenital anomaly. No associated subuctaneous soft tissue findings. IMPRESSION: 1. No acute intrathoracic, intra-abdominal, intrapelvic abnormality. 2. No acute fracture or traumatic listhesis of the thoracic and lumbosacral spine. 3. Anteriorly angulated coccyx which may be due to an old healed fracture versus congenital anomaly. No associated subcutaneus soft tissues finding. Correlate with tenderness to palpation on physical exam to evaluate for an acute component. 4. Other imaging findings of potential clinical significance: Cholelithiasis. Aortic  Atherosclerosis (ICD10-I70.0). Electronically Signed   By: Tish Frederickson M.D.   On: 06/07/2020 18:48   CT CERVICAL SPINE WO CONTRAST  Result Date: 06/07/2020 CLINICAL DATA:  Fell 20 feet from ladder EXAM: CT CERVICAL SPINE WITHOUT CONTRAST TECHNIQUE: Multidetector CT imaging of the cervical spine was performed without intravenous contrast. Multiplanar CT image reconstructions were also generated. COMPARISON:  None. FINDINGS: Alignment: Alignment is anatomic. Skull base and vertebrae: No acute displaced fracture. Soft tissues and spinal canal: No prevertebral fluid or swelling. No visible canal hematoma. Disc levels:  No significant spondylosis or facet hypertrophy. Upper chest: Airway is patent.  Lung apices are clear. Other: Reconstructed images demonstrate no additional findings. IMPRESSION: 1. No acute displaced cervical spine fracture. Electronically Signed   By: Sharlet Salina M.D.   On: 06/07/2020 18:34   CT ABDOMEN PELVIS W CONTRAST  Result Date: 06/07/2020 CLINICAL DATA:  Abdominal trauma, 20 foot fall from ladder. EXAM: CT CHEST WITH CONTRAST CT ABDOMEN WITHOUT AND WITH CONTRAST TECHNIQUE: Multidetector CT imaging of the abdomen was performed without intravenous contrast. Multidetector CT imaging of the chest and abdomen was then performed during bolus administration of intravenous contrast. CONTRAST:  OMNIPAQUE IOHEXOL 300 MG/ML  SOLN COMPARISON:  None. FINDINGS: CHEST: Ports and Devices: None. Lungs/airways: Bilateral lower lobe subsegmental atelectasis. Lingular linear atelectasis. No focal consolidation. Few scattered pulmonary micro nodules. No pulmonary mass. No pulmonary contusion or laceration. No pneumatocele formation. The central airways are patent. Pleura: No pleural effusion. No pneumothorax. No hemothorax. Lymph Nodes: No mediastinal, hilar, or axillary lymphadenopathy. Mediastinum: No pneumomediastinum. No aortic injury or mediastinal hematoma. The thoracic aorta is normal in  caliber. The heart is normal in size. No significant pericardial effusion. The esophagus is unremarkable. The thyroid is unremarkable. Chest Wall / Breasts: No chest wall mass. Musculoskeletal: No acute rib or sternal fracture. Sternal anatomy variant noted on the lateral view. No spinal fracture. Multilevel mild degenerative changes of the spine. ABDOMEN / PELVIS: Liver: Not enlarged. No focal lesion. No laceration or subcapsular hematoma. Biliary System: Calcified gallstones within the gallbladder lumen. No biliary ductal dilatation. Pancreas: Normal pancreatic contour. No main pancreatic duct dilatation. Spleen:  Not enlarged. No focal lesion. No laceration, subcapsular hematoma, or vascular injury. Adrenal Glands: No nodularity bilaterally. Kidneys: Bilateral kidneys enhance symmetrically. No hydronephrosis. No contusion, laceration, or subcapsular hematoma. No injury to the vascular structures or collecting systems. No hydroureter. The urinary bladder is unremarkable. On delayed imaging, there is no urothelial wall thickening and there are no filling defects in the opacified portions of the bilateral collecting systems or ureters. Bowel: No small or large bowel wall thickening or dilatation. The appendix is unremarkable. Mesentery, Omentum, and Peritoneum: No simple free fluid ascites. No pneumoperitoneum. No hemoperitoneum. No mesenteric hematoma identified. No organized fluid collection. Pelvic Organs: Normal. Lymph Nodes: Several prominent but not enlarged retroperitoneal lymph nodes are nonspecific. No abdominal, pelvic, inguinal lymphadenopathy. Vasculature: No abdominal aorta or iliac aneurysm. No active contrast extravasation or pseudoaneurysm. Mild calcified and noncalcified atherosclerotic plaque of the aorta and its branches. Musculoskeletal: Tiny fat containing umbilical hernia. No significant soft tissue hematoma. No acute pelvic fracture. No spinal fracture. Multilevel mild degenerative changes of  the spine. Coccyx is anteriorly angulated which may be due to an old healed fracture versus a congenital anomaly. No associated subuctaneous soft tissue findings. IMPRESSION: 1. No acute intrathoracic, intra-abdominal, intrapelvic abnormality. 2. No acute fracture or traumatic listhesis of the thoracic and lumbosacral spine. 3. Anteriorly angulated coccyx which may be due to an old healed fracture versus congenital anomaly. No associated subcutaneus soft tissues finding. Correlate with tenderness to palpation on physical exam to evaluate for an acute component. 4. Other imaging findings of potential clinical significance: Cholelithiasis. Aortic Atherosclerosis (ICD10-I70.0). Electronically Signed   By: Tish Frederickson M.D.   On: 06/07/2020 18:48   DG Pelvis Portable  Result Date: 06/07/2020 CLINICAL DATA:  Larey Seat EXAM: PORTABLE PELVIS 1-2 VIEWS COMPARISON:  None. FINDINGS: Frontal view of the pelvis and bilateral hips demonstrates no fractures. Mild symmetrical hip osteoarthritis. Alignment is anatomic. Soft tissues are normal. IMPRESSION: 1. Mild osteoarthritis.  No acute fracture. Electronically Signed   By: Sharlet Salina M.D.   On: 06/07/2020 17:13   DG Chest Port 1 View  Result Date: 06/07/2020 CLINICAL DATA:  Larey Seat EXAM: PORTABLE CHEST 1 VIEW COMPARISON:  None. FINDINGS: The heart size and mediastinal contours are within normal limits. Both lungs are clear. The visualized skeletal structures are unremarkable. IMPRESSION: No active disease. Electronically Signed   By: Sharlet Salina M.D.   On: 06/07/2020 17:12   DG Knee Left Port  Result Date: 06/08/2020 CLINICAL DATA:  Status post patellar repair EXAM: PORTABLE LEFT KNEE - 2 VIEW COMPARISON:  Intraoperative films from earlier in the same day. FINDINGS: Two fixation screws are noted traversing the patella. Fracture fragments are in anatomic alignment. No other focal abnormality is seen. IMPRESSION: Status post left patellar fixation. Electronically  Signed   By: Alcide Clever M.D.   On: 06/08/2020 00:59   DG Knee Left Port  Result Date: 06/07/2020 CLINICAL DATA:  Larey Seat EXAM: PORTABLE LEFT KNEE - 1-2 VIEW COMPARISON:  None. FINDINGS: Frontal and bilateral oblique views of the left knee are obtained. There is a comminuted distracted patellar fracture with up to 3 cm of separation of the fracture fragments. No other acute displaced fractures. Mild osteoarthritis within the medial and lateral compartments. Diffuse soft tissue edema. IMPRESSION: 1. Comminuted distracted left patellar fracture. 2. Mild osteoarthritis. Electronically Signed   By: Sharlet Salina M.D.   On: 06/07/2020 17:14   DG Ankle Right Port  Result Date: 06/07/2020 CLINICAL DATA:  Larey Seat EXAM: PORTABLE RIGHT  ANKLE - 2 VIEW COMPARISON:  None. FINDINGS: Frontal, oblique, and lateral views of the right ankle are obtained. No fracture, subluxation, or dislocation. Joint spaces are well preserved. Mild enthesopathic changes of the calcaneus. Mild lateral soft tissue swelling. IMPRESSION: 1. Mild lateral soft tissue swelling.  No acute fracture. Electronically Signed   By: Sharlet Salina M.D.   On: 06/07/2020 17:15   DG C-Arm 1-60 Min  Result Date: 06/08/2020 CLINICAL DATA:  Left patella ORIF EXAM: DG C-ARM 1-60 MIN; LEFT KNEE - 1-2 VIEW CONTRAST:  None FLUOROSCOPY TIME:  Fluoroscopy Time: Not listed, refer to operative report Radiation Exposure Index (if provided by the fluoroscopic device): Not listed, refer to operative report Number of Acquired Spot Images: 4 COMPARISON:  06/07/2020 FINDINGS: Four fluoroscopic intraoperative radiographs demonstrate open reduction and internal fixation of oblique patellar fracture utilizing 2 partially threaded screws. Fracture fragments are in anatomic alignment on this limited examination. No unexpected fracture or dislocation. IMPRESSION: Left patellar ORIF as described above. Electronically Signed   By: Helyn Numbers MD   On: 06/08/2020 00:51     Procedures .Critical Care Performed by: Terrilee Files, MD Authorized by: Terrilee Files, MD   Critical care provider statement:    Critical care time (minutes):  45   Critical care time was exclusive of:  Separately billable procedures and treating other patients   Critical care was necessary to treat or prevent imminent or life-threatening deterioration of the following conditions:  Trauma   Critical care was time spent personally by me on the following activities:  Discussions with consultants, evaluation of patient's response to treatment, examination of patient, ordering and performing treatments and interventions, ordering and review of laboratory studies, ordering and review of radiographic studies, pulse oximetry, re-evaluation of patient's condition, obtaining history from patient or surrogate, review of old charts and development of treatment plan with patient or surrogate   I assumed direction of critical care for this patient from another provider in my specialty: no     (including critical care time)  Medications Ordered in ED Medications  0.9 % irrigation (POUR BTL) (1,000 mLs Irrigation Given 06/07/20 2013)  cefTRIAXone (ROCEPHIN) 2 g in sodium chloride 0.9 % 100 mL IVPB (0 g Intravenous Stopped 06/07/20 1833)  vancomycin (VANCOCIN) IVPB 1000 mg/200 mL premix (0 mg Intravenous Stopped 06/07/20 1944)  morphine 4 MG/ML injection 4 mg (4 mg Intravenous Given 06/07/20 1728)  iohexol (OMNIPAQUE) 300 MG/ML solution 100 mL (100 mLs Intravenous Contrast Given 06/07/20 1818)    ED Course  I have reviewed the triage vital signs and the nursing notes.  Pertinent labs & imaging results that were available during my care of the patient were reviewed by me and considered in my medical decision making (see chart for details).  Clinical Course as of Jun 07 2034  Mon Jun 07, 2020  1717 Portable chest pelvis no acute findings interpreted by me.  Portable left knee comminuted  patellar fracture.  Portable left ankle with no gross fracture.  Awaiting radiology reading.   [MB]  1726 Discussed with Dr. Carola Frost orthopedics on-call.  He agrees with current management of IV antibiotics and wants him kept n.p.o. for possible OR tonight.   [MB]  1853 CT head cervical spine chest abdomen pelvis do not show any acute traumatic injuries.  Radiology does comment upon an abnormally positioned coccyx and queries whether any point tenderness.  Patient did not have any tenderness in that area on my exam.   [MB]  Clinical Course User Index [MB] Terrilee Files, MD   MDM Rules/Calculators/A&P                         This patient complains of 20 foot fall with right ankle left knee injury; this involves an extensive number of treatment Options and is a complaint that carries with it a high risk of complications and Morbidity. The differential includes fracture, open joint, intracranial bleed, neurologic disability, intra-abdominal trauma, vascular injury  I ordered, reviewed and interpreted labs, which included CBC with normal hemoglobin normal white count, chemistries with mildly elevated glucose likely reactive, lactic acid elevated, alcohol negative, Covid testing negative I ordered medication IV pain medicine, IV antibiotics for open fracture I ordered imaging studies which included CT head cervical spine chest abdomen and pelvis along with plain films of chest and pelvis, x-rays of left knee and right ankle and I independently    visualized and interpreted imaging which showed comminuted patellar fracture left and possible avulsion fracture right ankle Additional history obtained from EMS and patient's son Previous records obtained and reviewed in epic, no recent visits I consulted orthopedics Dr. Marcello Fennel and discussed lab and imaging findings  Critical Interventions: Work-up and management of level 2 trauma including open joint  After the interventions stated above, I  reevaluated the patient and found patient's pain to be adequately controlled.  He understands he will need to go to the operating room for washout and repair of his knee injury.   Final Clinical Impression(s) / ED Diagnoses Final diagnoses:  Fall  Trauma  Type III open sleeve fracture of left patella, initial encounter    Rx / DC Orders ED Discharge Orders    None       Terrilee Files, MD 06/08/20 1019

## 2020-06-07 NOTE — Consult Note (Signed)
Orthopaedic Trauma Service Consultation  Reason for Consult:Grade 3 open left patella fracture Referring Physician: Meridee Score, MD  Rodney KEEVEN is an 49 y.o. male.  HPI: Patient fell from 22 ft when the sign against which his ladder was leaning fell when the sign rotated. Immediate pain, bleeding form left knee. Denies LOC, other injury including wrist, sh, hips. Denies numbness or tingling. Denies neck pain and would like the C collar removed. Son at bedside.  PMH: none PSH: remote appy  No family history on file.  Social History:  denies alcohol use, and drug use. Smokes three cigarettes daily  Allergies: Not on File  Medications: Prior to Admission: None  Results for orders placed or performed during the hospital encounter of 06/07/20 (from the past 48 hour(s))  Sample to Blood Bank     Status: None   Collection Time: 06/07/20  5:00 PM  Result Value Ref Range   Blood Bank Specimen SAMPLE AVAILABLE FOR TESTING    Sample Expiration      06/08/2020,2359 Performed at [facility], [address] [place], [place] [address]   Comprehensive metabolic panel     Status: Abnormal   Collection Time: 06/07/20  5:07 PM  Result Value Ref Range   Sodium 139 135 - 145 mmol/L   Potassium 3.8 3.5 - 5.1 mmol/L   Chloride 104 98 - 111 mmol/L   CO2 26 22 - 32 mmol/L   Glucose, Bld 161 (H) 70 - 99 mg/dL    Comment: Glucose reference range applies only to samples taken after fasting for at least 8 hours.   BUN 9 6 - 20 mg/dL   Creatinine, Ser 2.40 0.61 - 1.24 mg/dL   Calcium 8.9 8.9 - 97.3 mg/dL   Total Protein 6.8 6.5 - 8.1 g/dL   Albumin 3.6 3.5 - 5.0 g/dL   AST 26 15 - 41 U/L   ALT 46 (H) 0 - 44 U/L   Alkaline Phosphatase 54 38 - 126 U/L   Total Bilirubin 0.6 0.3 - 1.2 mg/dL   GFR calc non Af Amer >60 >60 mL/min   GFR calc Af Amer >60 >60 mL/min   Anion gap 9 5 - 15    Comment: Performed at [facility], [address], [place], [place] [address]  CBC      Status: Abnormal   Collection Time: 06/07/20  5:07 PM  Result Value Ref Range   WBC 8.3 4.0 - 10.5 K/uL   RBC 4.82 4.22 - 5.81 MIL/uL   Hemoglobin 13.6 13.0 - 17.0 g/dL   HCT 24.2 39 - 52 %   MCV 86.5 80.0 - 100.0 fL   MCH 28.2 26.0 - 34.0 pg   MCHC 32.6 30.0 - 36.0 g/dL   RDW 68.3 (L) 41.9 - 62.2 %   Platelets 207 150 - 400 K/uL   nRBC 0.0 0.0 - 0.2 %    Comment: Performed at [facility], [address], [place], [place] [address]  Protime-INR     Status: None   Collection Time: 06/07/20  5:07 PM  Result Value Ref Range   Prothrombin Time 13.2 11.4 - 15.2 seconds   INR 1.0 0.8 - 1.2    Comment: (NOTE) INR goal varies based on device and disease states. Performed at [facility], [address], [place], [place] [address]   Ethanol     Status: None   Collection Time: 06/07/20  5:08 PM  Result  Value Ref Range   Alcohol, Ethyl (B) <10 <10 mg/dL    Comment: (NOTE) Lowest detectable limit for serum alcohol is 10 mg/dL.  For medical purposes only. Performed at [facility], [address] [place], [place] [address]   Lactic acid, plasma     Status: Abnormal   Collection Time: 06/07/20  5:08 PM  Result Value Ref Range   Lactic Acid, Venous 2.0 (HH) 0.5 - 1.9 mmol/L    Comment: CRITICAL RESULT CALLED TO, READ BACK BY AND VERIFIED WITH: NEWTON J,RN 06/07/20 1741 WAYK Performed at [facility], [address], [place], [place] [address]   I-Stat Chem 8, ED     Status: Abnormal   Collection Time: 06/07/20  5:14 PM  Result Value Ref Range   Sodium 141 135 - 145 mmol/L   Potassium 3.8 3.5 - 5.1 mmol/L   Chloride 102 98 - 111 mmol/L   BUN 11 6 - 20 mg/dL   Creatinine, Ser 9.74 0.61 - 1.24 mg/dL   Glucose, Bld 163 (H) 70 - 99 mg/dL    Comment: Glucose reference range applies only to samples taken after fasting for at least 8 hours.   Calcium, Ion 1.22 1.15 - 1.40 mmol/L   TCO2 25 22 - 32 mmol/L   Hemoglobin 13.3 13.0 - 17.0 g/dL   HCT 84.5 39 - 52 %     DG Pelvis Portable  Result Date: 06/07/2020 CLINICAL DATA:  Larey Seat EXAM: PORTABLE PELVIS 1-2 VIEWS COMPARISON:  None. FINDINGS: Frontal view of the pelvis and bilateral hips demonstrates no fractures. Mild symmetrical hip osteoarthritis. Alignment is anatomic. Soft tissues are normal. IMPRESSION: 1. Mild osteoarthritis.  No acute fracture. Electronically Signed   By: Sharlet Salina M.D.   On: 06/07/2020 17:13   DG Chest Port 1 View  Result Date: 06/07/2020 CLINICAL DATA:  Larey Seat EXAM: PORTABLE CHEST 1 VIEW COMPARISON:  None. FINDINGS: The heart size and mediastinal contours are within normal limits. Both lungs are clear. The visualized skeletal structures are unremarkable. IMPRESSION: No active disease. Electronically Signed   By: Sharlet Salina M.D.   On: 06/07/2020 17:12   DG Knee Left Port  Result Date: 06/07/2020 CLINICAL DATA:  Larey Seat EXAM: PORTABLE LEFT KNEE - 1-2 VIEW COMPARISON:  None. FINDINGS: Frontal and bilateral oblique views of the left knee are obtained. There is a comminuted distracted patellar fracture with up to 3 cm of separation of the fracture fragments. No other acute displaced fractures. Mild osteoarthritis within the medial and lateral compartments. Diffuse soft tissue edema. IMPRESSION: 1. Comminuted distracted left patellar fracture. 2. Mild osteoarthritis. Electronically Signed   By: Sharlet Salina M.D.   On: 06/07/2020 17:14   DG Ankle Right Port  Result Date: 06/07/2020 CLINICAL DATA:  Larey Seat EXAM: PORTABLE RIGHT ANKLE - 2 VIEW COMPARISON:  None. FINDINGS: Frontal, oblique, and lateral views of the right ankle are obtained. No fracture, subluxation, or dislocation. Joint spaces are well preserved. Mild enthesopathic changes of the calcaneus. Mild lateral soft tissue swelling. IMPRESSION: 1. Mild lateral soft tissue swelling.  No acute fracture. Electronically Signed   By: Sharlet Salina M.D.   On: 06/07/2020 17:15    ROS No recent fever, bleeding abnormalities, urologic  dysfunction, GI problems, or weight gain.  Blood pressure 140/75, pulse 63, temperature 98.6 F (37 C), temperature source Oral, resp. rate 14, SpO2 97 %. Physical Exam NCAT, C collar Heart RRR No chest retractions or audible wheezing S/NT/ND, obese RUEx shoulder, elbow,  wrist, digits- no skin wounds, nontender, no instability, no blocks to motion  Sens  Ax/R/M/U intact  Mot   Ax/ R/ PIN/ M/ AIN/ U intact  Rad 2+ LUEx shoulder, elbow, wrist, digits- no skin wounds, nontender, no instability, no blocks to motion  Sens  Ax/R/M/U intact  Mot   Ax/ R/ PIN/ M/ AIN/ U intact  Rad 2+ Pelvis--no traumatic wounds or rash, no ecchymosis, stable to manual stress, nontender RLE No traumatic wounds, ecchymosis, or rash  Tender lateral malleolus and associated swelling   No knee effusion  Knee stable to varus/ valgus and anterior/posterior stress  Sens DPN, SPN, TN intact  Motor EHL, ext, flex, evers 5/5  DP 2+, PT 2+, No significant edema LLE Large anterior traumatic wound, transverse, hemicircumferential just under patella  Tender patella  No ankle effusion or restriction in motion  No pain with axial loading of the extremity or logrolling of the hip  Sens DPN, SPN, TN intact  Motor EHL, ext, flex, evers intact  DP 2+, PT 2+, No significant edema   Assessment/Plan: Fall 22 feet Grade 3 open left patella Right lateral malleolus tenderness and swelling  PLAN: Given the findings the history and examination findings  1. X-rays of left ankle to eval for fracture 2. CT scans head, neck, pelvis 3. Clear cervical spine preop most likely if negative scan 4. To OR for debridement and ORIF of left patella, IV Abx for 1-2 days, PT  I discussed with the patient through his son who interpreted the risks and benefits of surgery, including the possibility of infection, nerve injury, vessel injury, wound breakdown, arthritis, symptomatic hardware, DVT/ PE, loss of motion, malunion, nonunion, and need  for further surgery among others.  We also specifically discussed the elevated risk of infection, nonunion, and symptomatic hardware that could require later removal.  He acknowledged these risks and wished to proceed.  Consent form signed and at the bedside.  [name], MD [facility], [facility] [phone]  06/07/2020  6:03 PM

## 2020-06-07 NOTE — Anesthesia Procedure Notes (Signed)
Procedure Name: Intubation Date/Time: 06/07/2020 9:26 PM Performed by: Claudina Lick, CRNA Pre-anesthesia Checklist: Patient identified, Emergency Drugs available, Suction available, Patient being monitored and Timeout performed Patient Re-evaluated:Patient Re-evaluated prior to induction Oxygen Delivery Method: Circle system utilized Preoxygenation: Pre-oxygenation with 100% oxygen Induction Type: IV induction and Cricoid Pressure applied Laryngoscope Size: Glidescope (DL x1 by CRNA with Hyacinth Meeker 2- grade3/4 view. Mask ventilation. DL x 1 by CRNA with Glidescope- grade 1 view with easy placement of ETT.) Grade View: Grade I Tube type: Oral Tube size: 7.5 mm Airway Equipment and Method: Stylet Placement Confirmation: ETT inserted through vocal cords under direct vision,  positive ETCO2 and breath sounds checked- equal and bilateral Secured at: 23 cm Tube secured with: Tape Dental Injury: Teeth and Oropharynx as per pre-operative assessment

## 2020-06-07 NOTE — Progress Notes (Signed)
Orthopedic Tech Progress Note Patient Details:  Rodney Pittman 27-Jan-1971 [id number] Level 2 trauma  Ortho Devices Type of Ortho Device: Knee Immobilizer Ortho Device/Splint Location: LLE Ortho Device/Splint Interventions: Ordered, Application, Adjustment   Post Interventions Patient Tolerated: Well Instructions Provided: Care of device, Adjustment of device, Poper ambulation with device   [name] 06/07/2020, 5:40 PM

## 2020-06-07 NOTE — ED Triage Notes (Signed)
Pt was working on billboard 20 feet in the air. Wind blew, pt jumped, landed with L knee onto hollow pipe sticking out of the ground. Parts of knee cap still on scene. Copious amounts of blood, pulses intact distal to injury.

## 2020-06-07 NOTE — Anesthesia Preprocedure Evaluation (Addendum)
Anesthesia Evaluation  Patient identified by MRN, date of birth, ID band Patient awake    Reviewed: Allergy & Precautions, NPO status , Patient's Chart, lab work & pertinent test results  Airway Mallampati: III  TM Distance: >3 FB Neck ROM: Full    Dental no notable dental hx.    Pulmonary Current SmokerPatient did not abstain from smoking.,    Pulmonary exam normal breath sounds clear to auscultation       Cardiovascular negative cardio ROS Normal cardiovascular exam Rhythm:Regular Rate:Normal     Neuro/Psych negative neurological ROS  negative psych ROS   GI/Hepatic negative GI ROS, Neg liver ROS,   Endo/Other  negative endocrine ROS  Renal/GU negative Renal ROS     Musculoskeletal negative musculoskeletal ROS (+)   Abdominal   Peds  Hematology negative hematology ROS (+)   Anesthesia Other Findings OPEN INJURY LEFT KNEE  Reproductive/Obstetrics                            Anesthesia Physical Anesthesia Plan  ASA: II and emergent  Anesthesia Plan: General   Post-op Pain Management:    Induction: Intravenous  PONV Risk Score and Plan: 2 and Ondansetron, Dexamethasone, Midazolam and Treatment may vary due to age or medical condition  Airway Management Planned: Oral ETT  Additional Equipment:   Intra-op Plan:   Post-operative Plan: Extubation in OR  Informed Consent: I have reviewed the patients History and Physical, chart, labs and discussed the procedure including the risks, benefits and alternatives for the proposed anesthesia with the patient or authorized representative who has indicated his/her understanding and acceptance.     Dental advisory given and Interpreter used for interveiw  Plan Discussed with: CRNA and Surgeon  Anesthesia Plan Comments: (Son utilized for language assistance and interpretation)      Anesthesia Quick Evaluation

## 2020-06-08 ENCOUNTER — Encounter (HOSPITAL_COMMUNITY): Payer: Self-pay | Admitting: Orthopedic Surgery

## 2020-06-08 ENCOUNTER — Inpatient Hospital Stay (HOSPITAL_COMMUNITY): Payer: Self-pay

## 2020-06-08 DIAGNOSIS — F17200 Nicotine dependence, unspecified, uncomplicated: Secondary | ICD-10-CM | POA: Diagnosis present

## 2020-06-08 DIAGNOSIS — S82042B Displaced comminuted fracture of left patella, initial encounter for open fracture type I or II: Secondary | ICD-10-CM | POA: Diagnosis present

## 2020-06-08 LAB — CBC
HCT: 35.9 % — ABNORMAL LOW (ref 39.0–52.0)
Hemoglobin: 12 g/dL — ABNORMAL LOW (ref 13.0–17.0)
MCH: 28.7 pg (ref 26.0–34.0)
MCHC: 33.4 g/dL (ref 30.0–36.0)
MCV: 85.9 fL (ref 80.0–100.0)
Platelets: 195 10*3/uL (ref 150–400)
RBC: 4.18 MIL/uL — ABNORMAL LOW (ref 4.22–5.81)
RDW: 11.4 % — ABNORMAL LOW (ref 11.5–15.5)
WBC: 9.1 10*3/uL (ref 4.0–10.5)
nRBC: 0 % (ref 0.0–0.2)

## 2020-06-08 LAB — BASIC METABOLIC PANEL
Anion gap: 7 (ref 5–15)
BUN: 9 mg/dL (ref 6–20)
CO2: 26 mmol/L (ref 22–32)
Calcium: 8.4 mg/dL — ABNORMAL LOW (ref 8.9–10.3)
Chloride: 103 mmol/L (ref 98–111)
Creatinine, Ser: 0.68 mg/dL (ref 0.61–1.24)
GFR calc Af Amer: >60 mL/min (ref >60)
GFR calc non Af Amer: >60 mL/min (ref >60)
Glucose, Bld: 220 mg/dL — ABNORMAL HIGH (ref 70–99)
Potassium: 4 mmol/L (ref 3.5–5.1)
Sodium: 136 mmol/L (ref 135–145)

## 2020-06-08 LAB — VITAMIN D 25 HYDROXY (VIT D DEFICIENCY, FRACTURES): Vit D, 25-Hydroxy: 30.99 ng/mL (ref 30–100)

## 2020-06-08 MED ORDER — SODIUM CHLORIDE 0.9 % IV SOLN
2.0000 g | INTRAVENOUS | Status: AC
  Administered 2020-06-08 – 2020-06-10 (×3): 2 g via INTRAVENOUS
  Filled 2020-06-08 (×3): qty 2

## 2020-06-08 MED ORDER — PROMETHAZINE HCL 25 MG/ML IJ SOLN: 6.2500 mg | INTRAMUSCULAR | Status: DC | PRN

## 2020-06-08 MED ORDER — OXYCODONE HCL 5 MG PO TABS
ORAL_TABLET | ORAL | Status: AC
  Filled 2020-06-08: qty 1

## 2020-06-08 MED ORDER — ACETAMINOPHEN 325 MG PO TABS
325.0000 mg | ORAL_TABLET | Freq: Four times a day (QID) | ORAL | Status: DC | PRN
  Administered 2020-06-10: 650 mg via ORAL
  Filled 2020-06-08: qty 2

## 2020-06-08 MED ORDER — HYDROMORPHONE HCL 1 MG/ML IJ SOLN
0.2500 mg | INTRAMUSCULAR | Status: DC | PRN
  Administered 2020-06-08 (×2): 0.5 mg via INTRAVENOUS

## 2020-06-08 MED ORDER — OXYCODONE HCL 5 MG PO TABS
5.0000 mg | ORAL_TABLET | Freq: Once | ORAL | Status: AC | PRN
  Administered 2020-06-08: 5 mg via ORAL

## 2020-06-08 MED ORDER — OXYCODONE HCL 5 MG/5ML PO SOLN: 5.0000 mg | Freq: Once | ORAL | Status: AC | PRN

## 2020-06-08 MED ORDER — METHOCARBAMOL 500 MG PO TABS
1000.0000 mg | ORAL_TABLET | Freq: Three times a day (TID) | ORAL | Status: DC
  Administered 2020-06-08 – 2020-06-10 (×8): 1000 mg via ORAL
  Filled 2020-06-08 (×9): qty 2

## 2020-06-08 MED ORDER — METOCLOPRAMIDE HCL 5 MG/ML IJ SOLN: 5.0000 mg | Freq: Three times a day (TID) | INTRAMUSCULAR | Status: DC | PRN

## 2020-06-08 MED ORDER — ENOXAPARIN SODIUM 40 MG/0.4ML ~~LOC~~ SOLN
40.0000 mg | SUBCUTANEOUS | Status: DC
  Administered 2020-06-09 – 2020-06-10 (×2): 40 mg via SUBCUTANEOUS
  Filled 2020-06-08 (×2): qty 0.4

## 2020-06-08 MED ORDER — ONDANSETRON HCL 4 MG PO TABS: 4.0000 mg | ORAL_TABLET | Freq: Four times a day (QID) | ORAL | Status: DC | PRN

## 2020-06-08 MED ORDER — ACETAMINOPHEN 500 MG PO TABS
1000.0000 mg | ORAL_TABLET | Freq: Three times a day (TID) | ORAL | Status: AC
  Administered 2020-06-08 (×3): 1000 mg via ORAL
  Filled 2020-06-08 (×3): qty 2

## 2020-06-08 MED ORDER — KETOROLAC TROMETHAMINE 15 MG/ML IJ SOLN
15.0000 mg | Freq: Three times a day (TID) | INTRAMUSCULAR | Status: AC
  Administered 2020-06-08 – 2020-06-09 (×4): 15 mg via INTRAVENOUS
  Filled 2020-06-08 (×4): qty 1

## 2020-06-08 MED ORDER — ACETAMINOPHEN 10 MG/ML IV SOLN: 1000.0000 mg | Freq: Once | INTRAVENOUS | Status: DC | PRN

## 2020-06-08 MED ORDER — OXYCODONE HCL 5 MG PO TABS
5.0000 mg | ORAL_TABLET | ORAL | Status: DC | PRN
  Administered 2020-06-08: 10 mg via ORAL
  Filled 2020-06-08 (×3): qty 2

## 2020-06-08 MED ORDER — MAGNESIUM CITRATE PO SOLN: 1.0000 | Freq: Once | ORAL | Status: DC | PRN

## 2020-06-08 MED ORDER — VITAMIN D 25 MCG (1000 UNIT) PO TABS
2000.0000 [IU] | ORAL_TABLET | Freq: Two times a day (BID) | ORAL | Status: DC
  Administered 2020-06-08 – 2020-06-10 (×6): 2000 [IU] via ORAL
  Filled 2020-06-08 (×6): qty 2

## 2020-06-08 MED ORDER — HYDROMORPHONE HCL 1 MG/ML IJ SOLN
0.5000 mg | INTRAMUSCULAR | Status: DC | PRN
  Administered 2020-06-09 (×2): 1 mg via INTRAVENOUS
  Filled 2020-06-08 (×2): qty 1

## 2020-06-08 MED ORDER — METRONIDAZOLE IN NACL 5-0.79 MG/ML-% IV SOLN
500.0000 mg | Freq: Three times a day (TID) | INTRAVENOUS | Status: AC
  Administered 2020-06-08 – 2020-06-10 (×9): 500 mg via INTRAVENOUS
  Filled 2020-06-08 (×9): qty 100

## 2020-06-08 MED ORDER — SUGAMMADEX SODIUM 200 MG/2ML IV SOLN
INTRAVENOUS | Status: DC | PRN
  Administered 2020-06-07: 200 mg via INTRAVENOUS

## 2020-06-08 MED ORDER — DOCUSATE SODIUM 100 MG PO CAPS
100.0000 mg | ORAL_CAPSULE | Freq: Two times a day (BID) | ORAL | Status: DC
  Administered 2020-06-08 – 2020-06-10 (×5): 100 mg via ORAL
  Filled 2020-06-08 (×5): qty 1

## 2020-06-08 MED ORDER — PANTOPRAZOLE SODIUM 40 MG PO TBEC
40.0000 mg | DELAYED_RELEASE_TABLET | Freq: Every day | ORAL | Status: DC
  Administered 2020-06-08 – 2020-06-10 (×3): 40 mg via ORAL
  Filled 2020-06-08 (×3): qty 1

## 2020-06-08 MED ORDER — ONDANSETRON HCL 4 MG/2ML IJ SOLN: 4.0000 mg | Freq: Four times a day (QID) | INTRAMUSCULAR | Status: DC | PRN

## 2020-06-08 MED ORDER — ASCORBIC ACID 500 MG PO TABS
500.0000 mg | ORAL_TABLET | Freq: Every day | ORAL | Status: DC
  Administered 2020-06-08 – 2020-06-10 (×3): 500 mg via ORAL
  Filled 2020-06-08 (×3): qty 1

## 2020-06-08 MED ORDER — METOCLOPRAMIDE HCL 5 MG PO TABS: 5.0000 mg | ORAL_TABLET | Freq: Three times a day (TID) | ORAL | Status: DC | PRN

## 2020-06-08 MED ORDER — POTASSIUM CHLORIDE IN NACL 20-0.9 MEQ/L-% IV SOLN
INTRAVENOUS | Status: DC
  Filled 2020-06-08 (×5): qty 1000

## 2020-06-08 MED ORDER — OXYCODONE HCL 5 MG PO TABS
10.0000 mg | ORAL_TABLET | ORAL | Status: DC | PRN
  Administered 2020-06-08 – 2020-06-09 (×2): 15 mg via ORAL
  Administered 2020-06-10: 10 mg via ORAL
  Administered 2020-06-10: 15 mg via ORAL
  Filled 2020-06-08 (×3): qty 3

## 2020-06-08 MED ORDER — METHOCARBAMOL 1000 MG/10ML IJ SOLN: 500.0000 mg | Freq: Three times a day (TID) | INTRAVENOUS | Status: DC

## 2020-06-08 MED ORDER — ADULT MULTIVITAMIN W/MINERALS CH
1.0000 | ORAL_TABLET | Freq: Every day | ORAL | Status: DC
  Administered 2020-06-08 – 2020-06-10 (×3): 1 via ORAL
  Filled 2020-06-08 (×3): qty 1

## 2020-06-08 MED ORDER — POLYETHYLENE GLYCOL 3350 17 G PO PACK
17.0000 g | PACK | Freq: Every day | ORAL | Status: DC
  Administered 2020-06-08 – 2020-06-10 (×3): 17 g via ORAL
  Filled 2020-06-08 (×3): qty 1

## 2020-06-08 MED ORDER — HYDROMORPHONE HCL 1 MG/ML IJ SOLN
INTRAMUSCULAR | Status: AC
  Filled 2020-06-08: qty 1

## 2020-06-08 NOTE — Evaluation (Signed)
Physical Therapy Evaluation Patient Details Name: TAHMIR KLECKNER MRN: [id number] DOB: Sep 03, 1971 Today's Date: 06/08/2020   History of Present Illness  RAFFAELE DERISE is a 49 y.o. male.  He is a level 2 trauma.  He was working on a billboard standing on a ladder 20 feet up in the air when the wind blew and the patient fell approximately 20 feet to the ground striking his left knee on a hollow pipe that was sticking out.  Open injury to left knee.  Clinical Impression  Patient received in bed, wife present for evaluation. Utilized Research officer, trade union, Aeronautical engineer, for session. Patient is agreeable to PT session. Reports pain in L LE. KI donned. He requires min assist for bed mobility to raise trunk and to bring L LE on and off bed. Cues for hand placement and min assist for sit to stand transfer. Cues for sequencing when using rolling walker and taking steps, min guard. Pain limited with mobility at this time only able to take 1 step forward and then 4 side steps along edge of bed. He will continue to benefit from skilled PT while here to improve functional mobility and independence for return home.        Follow Up Recommendations Home health PT;Supervision - Intermittent    Equipment Recommendations  Rolling walker with 5" wheels    Recommendations for Other Services       Precautions / Restrictions Precautions Precautions: Fall Restrictions Weight Bearing Restrictions: No Other Position/Activity Restrictions: WBAT L LE      Mobility  Bed Mobility Overal bed mobility: Needs Assistance Bed Mobility: Supine to Sit;Sit to Supine     Supine to sit: Min assist Sit to supine: Min assist   General bed mobility comments: assist to raise trunk to seated position and to bring L LE on and off bed.  Transfers Overall transfer level: Needs assistance Equipment used: Rolling walker (2 wheeled) Transfers: Sit to/from Stand Sit to Stand: Min guard         General transfer  comment: cues for safety/hand placement  Ambulation/Gait Ambulation/Gait assistance: Min guard Gait Distance (Feet): 3 Feet Assistive device: Rolling walker (2 wheeled) Gait Pattern/deviations: Step-to pattern;Decreased step length - left;Decreased step length - right;Decreased weight shift to left;Antalgic Gait velocity: decreased   General Gait Details: patient able to take 1 step forward and back and 4 side steps with RW and min guard.  Cues for sequencing, WB status and use of RW  Stairs            Wheelchair Mobility    Modified Rankin (Stroke Patients Only)       Balance Overall balance assessment: Needs assistance Sitting-balance support: Bilateral upper extremity supported;Feet supported Sitting balance-Leahy Scale: Good     Standing balance support: Bilateral upper extremity supported;During functional activity Standing balance-Leahy Scale: Fair Standing balance comment: reliant on RW due to pain in left LE                             Pertinent Vitals/Pain Pain Assessment: Faces Faces Pain Scale: Hurts whole lot Pain Location: Left knee Pain Descriptors / Indicators: Discomfort;Grimacing;Guarding;Sore Pain Intervention(s): Limited activity within patient's tolerance;Monitored during session;Repositioned    Home Living Family/patient expects to be discharged to:: Private residence Living Arrangements: Spouse/significant other Available Help at Discharge: Family;Available 24 hours/day Type of Home: House Home Access: Stairs to enter Entrance Stairs-Rails: Right;Left;Can reach both Entrance Stairs-Number of Steps: 3 Home Layout:  One level Home Equipment: None      Prior Function Level of Independence: Independent         Comments: working, fully independent     Higher education careers adviser        Extremity/Trunk Assessment   Upper Extremity Assessment Upper Extremity Assessment: Defer to OT evaluation    Lower Extremity Assessment Lower  Extremity Assessment: LLE deficits/detail LLE Deficits / Details: KI donned, patient to keep knee extended. LLE: Unable to fully assess due to immobilization LLE Sensation: WNL LLE Coordination: decreased gross motor    Cervical / Trunk Assessment Cervical / Trunk Assessment: Normal  Communication   Communication: Prefers language other than English  Cognition Arousal/Alertness: Awake/alert Behavior During Therapy: WFL for tasks assessed/performed Overall Cognitive Status: Within Functional Limits for tasks assessed                                        General Comments      Exercises     Assessment/Plan    PT Assessment Patient needs continued PT services  PT Problem List Decreased strength;Decreased mobility;Decreased knowledge of precautions;Decreased knowledge of use of DME;Decreased activity tolerance;Pain       PT Treatment Interventions DME instruction;Therapeutic activities;Gait training;Therapeutic exercise;Patient/family education;Stair training;Balance training;Functional mobility training    PT Goals (Current goals can be found in the Care Plan section)  Acute Rehab PT Goals Patient Stated Goal: to return home PT Goal Formulation: With patient/family Time For Goal Achievement: 06/15/20 Potential to Achieve Goals: Good    Frequency Min 3X/week   Barriers to discharge   has 3 steps to enter home    Co-evaluation               AM-PAC PT "6 Clicks" Mobility  Outcome Measure Help needed turning from your back to your side while in a flat bed without using bedrails?: A Little Help needed moving from lying on your back to sitting on the side of a flat bed without using bedrails?: A Little Help needed moving to and from a bed to a chair (including a wheelchair)?: A Lot Help needed standing up from a chair using your arms (e.g., wheelchair or bedside chair)?: A Little Help needed to walk in hospital room?: A Lot Help needed climbing 3-5  steps with a railing? : A Lot 6 Click Score: 15    End of Session Equipment Utilized During Treatment: Left knee immobilizer Activity Tolerance: Patient limited by pain Patient left: in bed;with call bell/phone within reach;with family/visitor present Nurse Communication: Mobility status PT Visit Diagnosis: Difficulty in walking, not elsewhere classified (R26.2);Other abnormalities of gait and mobility (R26.89);Pain Pain - Right/Left: Left Pain - part of body: Leg    Time: 1130-1152 PT Time Calculation (min) (ACUTE ONLY): 22 min   Charges:   PT Evaluation $PT Eval Moderate Complexity: 1 Mod PT Treatments $Therapeutic Activity: 8-22 mins        Lativia Velie, PT, GCS 06/08/20,12:07 PM

## 2020-06-08 NOTE — Anesthesia Postprocedure Evaluation (Signed)
Anesthesia Post Note  Patient: Rodney Pittman  Procedure(s) Performed: IRRIGATION AND DEBRIDEMENT KNEE POSSIBLE REPAIR (Left Knee) OPEN REDUCTION INTERNAL (ORIF) FIXATION PATELLA (Knee) APPLICATION OF WOUND VAC (Left Knee)     Patient location during evaluation: PACU Anesthesia Type: General Level of consciousness: awake Pain management: pain level controlled Vital Signs Assessment: post-procedure vital signs reviewed and stable Respiratory status: spontaneous breathing, nonlabored ventilation, respiratory function stable and patient connected to nasal cannula oxygen Cardiovascular status: blood pressure returned to baseline and stable Postop Assessment: no apparent nausea or vomiting Anesthetic complications: no   No complications documented.  Last Vitals:  Vitals:   06/08/20 0134 06/08/20 0443  BP: 128/80 113/71  Pulse: 88 97  Resp: 20 17  Temp: 36.6 C 36.8 C  SpO2: 99% 100%    Last Pain:  Vitals:   06/08/20 0616  TempSrc:   PainSc: 5                  [name] P [name]

## 2020-06-08 NOTE — Progress Notes (Signed)
Orthopaedic Trauma Service Progress Note  Patient ID: EMARI DEMMER MRN: [id number] DOB/AGE: [birth year] 49 y.o.  Subjective:  Doing well Very appreciative   Translation via NT   L arm sore but not too bad   ROS As above  Objective:   VITALS:   Vitals:   06/08/20 0134 06/08/20 0443 06/08/20 1002 06/08/20 1321  BP: 128/80 113/71 123/82 127/67  Pulse: 88 97 90 89  Resp: 20 17 18 16   Temp: 97.8 F (36.6 C) 98.2 F (36.8 C) 98.2 F (36.8 C) 98.9 F (37.2 C)  TempSrc: Oral Oral Oral Oral  SpO2: 99% 100% 100% 96%    There is no height or weight on file to calculate BMI.   Intake/Output      09/20 0701 - 09/21 0700 09/21 0701 - 09/22 0700   P.O. 160    I.V. 2014.5    IV Piggyback 550    Total Intake 2724.5    Urine 300    Blood 100    Total Output 400    Net +2324.5           LABS  Results for orders placed or performed during the hospital encounter of 06/07/20 (from the past 24 hour(s))  Sample to Blood Bank     Status: None   Collection Time: 06/07/20  5:00 PM  Result Value Ref Range   Blood Bank Specimen SAMPLE AVAILABLE FOR TESTING    Sample Expiration      06/08/2020,2359 Performed at [facility], [address], [place], [place]   Comprehensive metabolic panel     Status: Abnormal   Collection Time: 06/07/20  5:07 PM  Result Value Ref Range   Sodium 139 135 - 145 mmol/L   Potassium 3.8 3.5 - 5.1 mmol/L   Chloride 104 98 - 111 mmol/L   CO2 26 22 - 32 mmol/L   Glucose, Bld 161 (H) 70 - 99 mg/dL   BUN 9 6 - 20 mg/dL   Creatinine, Ser 06/09/20 0.61 - 1.24 mg/dL   Calcium 8.9 8.9 - 2.12 mg/dL   Total Protein 6.8 6.5 - 8.1 g/dL   Albumin 3.6 3.5 - 5.0 g/dL   AST 26 15 - 41 U/L   ALT 46 (H) 0 - 44 U/L   Alkaline Phosphatase 54 38 - 126 U/L   Total Bilirubin 0.6 0.3 - 1.2 mg/dL   GFR calc non Af Amer >60 >60 mL/min   GFR calc Af Amer >60 >60 mL/min    Anion gap 9 5 - 15  CBC     Status: Abnormal   Collection Time: 06/07/20  5:07 PM  Result Value Ref Range   WBC 8.3 4.0 - 10.5 K/uL   RBC 4.82 4.22 - 5.81 MIL/uL   Hemoglobin 13.6 13.0 - 17.0 g/dL   HCT 06/09/20 39 - 52 %   MCV 86.5 80.0 - 100.0 fL   MCH 28.2 26.0 - 34.0 pg   MCHC 32.6 30.0 - 36.0 g/dL   RDW 25.0 (L) 03.7 - 04.8 %   Platelets 207 150 - 400 K/uL   nRBC 0.0 0.0 - 0.2 %  Protime-INR     Status: None   Collection Time: 06/07/20  5:07 PM  Result Value Ref Range   Prothrombin Time 13.2 11.4 -  15.2 seconds   INR 1.0 0.8 - 1.2  Ethanol     Status: None   Collection Time: 06/07/20  5:08 PM  Result Value Ref Range   Alcohol, Ethyl (B) <10 <10 mg/dL  Lactic acid, plasma     Status: Abnormal   Collection Time: 06/07/20  5:08 PM  Result Value Ref Range   Lactic Acid, Venous 2.0 (HH) 0.5 - 1.9 mmol/L  I-Stat Chem 8, ED     Status: Abnormal   Collection Time: 06/07/20  5:14 PM  Result Value Ref Range   Sodium 141 135 - 145 mmol/L   Potassium 3.8 3.5 - 5.1 mmol/L   Chloride 102 98 - 111 mmol/L   BUN 11 6 - 20 mg/dL   Creatinine, Ser 4.12 0.61 - 1.24 mg/dL   Glucose, Bld 878 (H) 70 - 99 mg/dL   Calcium, Ion 6.76 7.20 - 1.40 mmol/L   TCO2 25 22 - 32 mmol/L   Hemoglobin 13.3 13.0 - 17.0 g/dL   HCT 94.7 39 - 52 %  SARS Coronavirus 2 by RT PCR (hospital order, performed in [facility] hospital lab)     Status: None   Collection Time: 06/07/20  6:00 PM  Result Value Ref Range   SARS Coronavirus 2 NEGATIVE NEGATIVE  VITAMIN D 25 Hydroxy (Vit-D Deficiency, Fractures)     Status: None   Collection Time: 06/08/20  1:51 AM  Result Value Ref Range   Vit D, 25-Hydroxy 30.99 30 - 100 ng/mL  CBC     Status: Abnormal   Collection Time: 06/08/20  1:51 AM  Result Value Ref Range   WBC 9.1 4.0 - 10.5 K/uL   RBC 4.18 (L) 4.22 - 5.81 MIL/uL   Hemoglobin 12.0 (L) 13.0 - 17.0 g/dL   HCT 09.6 (L) 39 - 52 %   MCV 85.9 80.0 - 100.0 fL   MCH 28.7 26.0 - 34.0 pg   MCHC 33.4 30.0 - 36.0 g/dL    RDW 28.3 (L) 66.2 - 15.5 %   Platelets 195 150 - 400 K/uL   nRBC 0.0 0.0 - 0.2 %  Basic metabolic panel     Status: Abnormal   Collection Time: 06/08/20  1:51 AM  Result Value Ref Range   Sodium 136 135 - 145 mmol/L   Potassium 4.0 3.5 - 5.1 mmol/L   Chloride 103 98 - 111 mmol/L   CO2 26 22 - 32 mmol/L   Glucose, Bld 220 (H) 70 - 99 mg/dL   BUN 9 6 - 20 mg/dL   Creatinine, Ser 9.47 0.61 - 1.24 mg/dL   Calcium 8.4 (L) 8.9 - 10.3 mg/dL   GFR calc non Af Amer >60 >60 mL/min   GFR calc Af Amer >60 >60 mL/min   Anion gap 7 5 - 15     PHYSICAL EXAM:   Gen: resting comfortably in bed, NAD, appears well  Lungs: unlabored  Cardiac: RRR Abd: + BS, NTND Pelvis: no instability  Ext:   Left Upper Extremity    shoulder, elbow, wrist, digits- no skin wounds, nontender, no instability, no blocks to motion   Sens  Ax/R/M/U intact   Mot   Ax/ R/ PIN/ M/ AIN/ U intact   Rad 2+   Left Lower Extremity    Dressing c/d/i   Ext warm    Swelling stable   + DP pulse   VAC functioning well, good seal    DPN, SPN, TN sensation intact   EHL, FHL,  lesser toe motor intact   Ankle flexion, extension, inversion and eversion intact    Compartments are soft, no pain with passive stretch   Assessment/Plan: 1 Day Post-Op   Active Problems:   Fall   Open comminuted fracture of left patella   Anti-infectives (From admission, onward)   Start     Dose/Rate Route Frequency Ordered Stop   06/08/20 1600  cefTRIAXone (ROCEPHIN) 2 g in sodium chloride 0.9 % 100 mL IVPB       "And" Linked Group Details   2 g 200 mL/hr over 30 Minutes Intravenous Every 24 hours 06/08/20 0026 06/11/20 1559   06/08/20 0200  metroNIDAZOLE (FLAGYL) IVPB 500 mg       "And" Linked Group Details   500 mg 100 mL/hr over 60 Minutes Intravenous Every 8 hours 06/08/20 0026 06/11/20 0159   06/07/20 1700  cefTRIAXone (ROCEPHIN) 2 g in sodium chloride 0.9 % 100 mL IVPB        2 g 200 mL/hr over 30 Minutes Intravenous  Once  06/07/20 1647 06/07/20 1833   06/07/20 1700  vancomycin (VANCOCIN) IVPB 1000 mg/200 mL premix        1,000 mg 200 mL/hr over 60 Minutes Intravenous  Once 06/07/20 1647 06/07/20 1944    .  POD/HD#:   49 y/o male s/p fall off ladder with open L patella fracture   -grade 3 open L patella fracture s/p I&D and ORIF, patellar tendon repair (inferomedial fragment missing)  WBAT L leg with knee in full extension and brace on   No knee ROM x 2 weeks   No active knee extension x 8 weeks  Will order hinged knee brace, locked in full extension   PT/OT  Ice and elevate  Ankle and toe motion as tolerated    Dressing change prior to dc   - Pain management:  Continue with current regimen   Multimodal   - ABL anemia/Hemodynamics  Stable  - Medical issues   No chronic issues  - DVT/PE prophylaxis:  lovenox x 14 days  - ID:   Rocephin and flagyl x 72 hours from surgery for grade 3 open fracture  - Metabolic Bone Disease:  Vitamin d levels are reasonable   - Activity:  As above  - FEN/GI prophylaxis/Foley/Lines:  Reg diet  - Impediments to fracture healing:  Open fracture   - Dispo:  Continue with therapies, IV abx  Likely home tomorrow afternoon after 3rd does of rocephin vs home Thursday     Mearl Latin, PA-C [phone] (C) 06/08/2020, 2:18 PM  [facility] [address] [place] [address] [phone] Collier Bullock (F)

## 2020-06-08 NOTE — Progress Notes (Signed)
Orthopedic Tech Progress Note Patient Details:  [name] Feb 03, 1971 [id number] Called [employer] for knee brace. Patient ID: Rodney Pittman, male   DOB: 02/09/1971, 49 y.o.   MRN: [id number]   Kerry Fort 06/08/2020, 4:05 PM

## 2020-06-08 NOTE — Transfer of Care (Signed)
Immediate Anesthesia Transfer of Care Note  Patient: Rodney Pittman  Procedure(s) Performed: IRRIGATION AND DEBRIDEMENT KNEE POSSIBLE REPAIR (Left Knee) OPEN REDUCTION INTERNAL (ORIF) FIXATION PATELLA (Knee) APPLICATION OF WOUND VAC (Left Knee)  Patient Location: PACU  Anesthesia Type:General  Level of Consciousness: drowsy  Airway & Oxygen Therapy: Patient Spontanous Breathing and Patient connected to face mask oxygen  Post-op Assessment: Report given to RN and Post -op Vital signs reviewed and stable  Post vital signs: Reviewed and stable  Last Vitals:  Vitals Value Taken Time  BP 127/87 06/08/20 0019  Temp    Pulse 91 06/08/20 0023  Resp 18 06/08/20 0023  SpO2 100 % 06/08/20 0023  Vitals shown include unvalidated device data.  Last Pain:  Vitals:   06/07/20 1832  TempSrc:   PainSc: 7          Complications: No complications documented.

## 2020-06-09 ENCOUNTER — Encounter (HOSPITAL_COMMUNITY): Payer: Self-pay | Admitting: Orthopedic Surgery

## 2020-06-09 DIAGNOSIS — S81009A Unspecified open wound, unspecified knee, initial encounter: Secondary | ICD-10-CM

## 2020-06-09 DIAGNOSIS — S8261XA Displaced fracture of lateral malleolus of right fibula, initial encounter for closed fracture: Secondary | ICD-10-CM | POA: Diagnosis present

## 2020-06-09 DIAGNOSIS — E119 Type 2 diabetes mellitus without complications: Secondary | ICD-10-CM

## 2020-06-09 HISTORY — DX: Displaced fracture of lateral malleolus of right fibula, initial encounter for closed fracture: S82.61XA

## 2020-06-09 HISTORY — DX: Type 2 diabetes mellitus without complications: E11.9

## 2020-06-09 HISTORY — DX: Unspecified open wound, unspecified knee, initial encounter: S81.009A

## 2020-06-09 LAB — CBC
HCT: 34.1 % — ABNORMAL LOW (ref 39.0–52.0)
Hemoglobin: 11.2 g/dL — ABNORMAL LOW (ref 13.0–17.0)
MCH: 29 pg (ref 26.0–34.0)
MCHC: 32.8 g/dL (ref 30.0–36.0)
MCV: 88.3 fL (ref 80.0–100.0)
Platelets: 175 10*3/uL (ref 150–400)
RBC: 3.86 MIL/uL — ABNORMAL LOW (ref 4.22–5.81)
RDW: 11.6 % (ref 11.5–15.5)
WBC: 7 10*3/uL (ref 4.0–10.5)
nRBC: 0 % (ref 0.0–0.2)

## 2020-06-09 LAB — BASIC METABOLIC PANEL
Anion gap: 8 (ref 5–15)
BUN: 6 mg/dL (ref 6–20)
CO2: 26 mmol/L (ref 22–32)
Calcium: 8.5 mg/dL — ABNORMAL LOW (ref 8.9–10.3)
Chloride: 103 mmol/L (ref 98–111)
Creatinine, Ser: 0.73 mg/dL (ref 0.61–1.24)
GFR calc Af Amer: >60 mL/min (ref >60)
GFR calc non Af Amer: >60 mL/min (ref >60)
Glucose, Bld: 244 mg/dL — ABNORMAL HIGH (ref 70–99)
Potassium: 4 mmol/L (ref 3.5–5.1)
Sodium: 137 mmol/L (ref 135–145)

## 2020-06-09 LAB — HEMOGLOBIN A1C
Hgb A1c MFr Bld: 6.8 % — ABNORMAL HIGH (ref 4.8–5.6)
Mean Plasma Glucose: 148.46 mg/dL

## 2020-06-09 MED ORDER — POLYETHYLENE GLYCOL 3350 17 G PO PACK: 17.0000 g | PACK | Freq: Every day | ORAL | 0 refills | Status: AC

## 2020-06-09 MED ORDER — METHOCARBAMOL 500 MG PO TABS: 500.0000 mg | ORAL_TABLET | Freq: Four times a day (QID) | ORAL | 0 refills | Status: DC | PRN

## 2020-06-09 MED ORDER — ASCORBIC ACID 500 MG PO TABS: 1000.0000 mg | ORAL_TABLET | Freq: Every day | ORAL | 1 refills | Status: AC

## 2020-06-09 MED ORDER — ADULT MULTIVITAMIN W/MINERALS CH: 1.0000 | ORAL_TABLET | Freq: Every day | ORAL | 2 refills | Status: AC

## 2020-06-09 MED ORDER — ACETAMINOPHEN 500 MG PO TABS: 500.0000 mg | ORAL_TABLET | Freq: Two times a day (BID) | ORAL | 0 refills | Status: DC

## 2020-06-09 MED ORDER — DOCUSATE SODIUM 100 MG PO CAPS: 100.0000 mg | ORAL_CAPSULE | Freq: Two times a day (BID) | ORAL | 0 refills | Status: DC

## 2020-06-09 MED ORDER — LIVING WELL WITH DIABETES BOOK - IN SPANISH
Freq: Once | Status: AC
  Filled 2020-06-09: qty 1

## 2020-06-09 MED ORDER — ENOXAPARIN SODIUM 40 MG/0.4ML ~~LOC~~ SOLN: 40.0000 mg | SUBCUTANEOUS | 0 refills | Status: AC

## 2020-06-09 MED ORDER — OXYCODONE-ACETAMINOPHEN 5-325 MG PO TABS: 1.0000 | ORAL_TABLET | Freq: Four times a day (QID) | ORAL | 0 refills | Status: AC | PRN

## 2020-06-09 MED ORDER — VITAMIN D 125 MCG (5000 UT) PO CAPS: 1.0000 | ORAL_CAPSULE | Freq: Every day | ORAL | 6 refills | Status: AC

## 2020-06-09 MED FILL — POLYETHYLENE GLYCOL 3350 PO: 17 | 14 days supply | Qty: 238 | Fill #0

## 2020-06-09 MED FILL — ACETAMINOPHEN 500MG XT STRE: 500 | 30 days supply | Qty: 60 | Fill #0

## 2020-06-09 MED FILL — OXYCODONE-APAP 5-325MG: 5-325 | 6 days supply | Qty: 50 | Fill #0

## 2020-06-09 MED FILL — VITAMIN C 500 MG TABLET: 500 | 30 days supply | Qty: 60 | Fill #0

## 2020-06-09 MED FILL — DOCUSATE SODIUM 100 MG CAPS: 100 | 5 days supply | Qty: 10 | Fill #0

## 2020-06-09 MED FILL — VITAMIN D3 5,000 UNIT TAB: 125 MCG | 30 days supply | Qty: 30 | Fill #0

## 2020-06-09 MED FILL — METHOCARBAMOL 500 MG TABS: 500 | 7 days supply | Qty: 60 | Fill #0

## 2020-06-09 MED FILL — ENOXAPARIN SODIUM 40 MG/0.4: 40 | 14 days supply | Qty: 6 | Fill #0

## 2020-06-09 NOTE — Progress Notes (Addendum)
Inpatient Diabetes Program Recommendations  AACE/ADA: New Consensus Statement on Inpatient Glycemic Control (2015)  Target Ranges:  Prepandial:   less than 140 mg/dL      Peak postprandial:   less than 180 mg/dL (1-2 hours)      Critically ill patients:  140 - 180 mg/dL   Lab Results  Component Value Date   HGBA1C 6.8 (H) 06/09/2020    Review of Glycemic Control Results for Rodney Pittman, Rodney Pittman (MRN [id number]) as of 06/09/2020 12:22  Ref. Range 06/08/2020 01:51 06/09/2020 05:05  Glucose Latest Ref Range: 70 - 99 mg/dL 686 (H) 168 (H)   Diabetes history: No Prior hx DM Outpatient Diabetes medications: None Current orders for Inpatient glycemic control: None  Inpatient Diabetes Program Recommendations:   Received consult regarding new onset Diabetes.  A1c 6.8. Ordered Living Well With Diabetes Spanish version along with dietician consult. Attached teaching materials to include in discharge materials regarding diabetes and nutrition, basic diabetes information.  Add glycemic control order set with sensitive correction tid + 0-5 units hs while in the hospital.  Thank you, Rodney Pittman. Rodney Knoedler, RN, MSN, CDE  Diabetes Coordinator Inpatient Glycemic Control Team Team Pager [phone] (8am-5pm) 06/09/2020 12:25 PM

## 2020-06-09 NOTE — Progress Notes (Signed)
Orthopedic Tech Progress Note Patient Details:  Rodney Pittman 02/09/1971 [id number] Left aircast at bedside because patient was in bed. Adjusted for patient's foot. Only need while walking. Ortho Devices Type of Ortho Device: Ankle Air splint Ortho Device/Splint Location: RLE Ortho Device/Splint Interventions: Ordered   Post Interventions Patient Tolerated:  (Did not apply- support only needed when walking) Instructions Provided: Adjustment of device   Herbert Deaner Bahja Bence 06/09/2020, 3:19 PM

## 2020-06-09 NOTE — Progress Notes (Signed)
Physical Therapy Treatment Patient Details Name: Rodney Pittman MRN: [id number] DOB: 11-06-1970 Today's Date: 06/09/2020    History of Present Illness Rodney Pittman is a 49 y.o. male.  He is a level 2 trauma.  He was working on a billboard standing on a ladder 20 feet up in the air when the wind blew and the patient fell approximately 20 feet to the ground striking his left knee on a hollow pipe that was sticking out.  Open injury to left knee.    PT Comments    Patient received in bed, sleeping due to pain medication. Patient easily roused, agrees to PT session. Interpreter via ipad used: Jesus [id number], [place] Post [phone]. Patient reports moderate pain in left knee. Brace donned and locked in extension. He requires min assist for bed mobility, transfers with min assist. Cues needed for weight bearing status, wbat. He is able to ambulate short distance in room due to pain in knee.  Educated patient about stair training for entrance into home.  He will continue to benefit from skilled PT while here to improve functional independence and safety with mobility for return home.       Follow Up Recommendations  Supervision - Intermittent;Home health PT;Supervision for mobility/OOB     Equipment Recommendations  Rolling walker with 5" wheels;3in1 (PT)    Recommendations for Other Services       Precautions / Restrictions Precautions Precautions: Fall Restrictions Weight Bearing Restrictions: No Other Position/Activity Restrictions: WBAT L LE    Mobility  Bed Mobility Overal bed mobility: Needs Assistance Bed Mobility: Supine to Sit;Sit to Supine     Supine to sit: Min assist Sit to supine: Min assist   General bed mobility comments: assist needed to raise trunk to seated position and to bring L LE off bed.  Transfers Overall transfer level: Needs assistance Equipment used: Rolling walker (2 wheeled) Transfers: Sit to/from Stand Sit to Stand: Min assist         General  transfer comment: cues for hand placement, likes to pull up on wife.  Ambulation/Gait Ambulation/Gait assistance: Min guard Gait Distance (Feet): 15 Feet Assistive device: Rolling walker (2 wheeled) Gait Pattern/deviations: Step-to pattern;Decreased stride length;Decreased step length - right;Decreased step length - left;Antalgic Gait velocity: decreased   General Gait Details: Patient able to walk in room with flexed posture, antalgic gait pattern, painful.   Stairs Stairs: Yes       General stair comments: educated patient on how to go up 3 steps with 1 rail. Patient and wife verbalize understanding.   Wheelchair Mobility    Modified Rankin (Stroke Patients Only)       Balance Overall balance assessment: Needs assistance Sitting-balance support: Feet supported Sitting balance-Leahy Scale: Good     Standing balance support: Bilateral upper extremity supported;During functional activity Standing balance-Leahy Scale: Fair Standing balance comment: reliant on RW due to pain in left LE                            Cognition Arousal/Alertness: Awake/alert Behavior During Therapy: WFL for tasks assessed/performed Overall Cognitive Status: Within Functional Limits for tasks assessed                                        Exercises      General Comments        Pertinent Vitals/Pain  Pain Assessment: 0-10 Pain Score: 5  Pain Location: Left knee Pain Descriptors / Indicators: Discomfort;Grimacing;Guarding;Sore Pain Intervention(s): Limited activity within patient's tolerance;Monitored during session;Repositioned;Ice applied    Home Living                      Prior Function            PT Goals (current goals can now be found in the care plan section) Acute Rehab PT Goals Patient Stated Goal: to return home PT Goal Formulation: With patient/family Time For Goal Achievement: 06/15/20 Potential to Achieve Goals: Good Progress  towards PT goals: Progressing toward goals    Frequency    Min 3X/week      PT Plan Current plan remains appropriate    Co-evaluation              AM-PAC PT "6 Clicks" Mobility   Outcome Measure  Help needed turning from your back to your side while in a flat bed without using bedrails?: A Little Help needed moving from lying on your back to sitting on the side of a flat bed without using bedrails?: A Little Help needed moving to and from a bed to a chair (including a wheelchair)?: A Little Help needed standing up from a chair using your arms (e.g., wheelchair or bedside chair)?: A Little Help needed to walk in hospital room?: A Little Help needed climbing 3-5 steps with a railing? : A Little 6 Click Score: 18    End of Session Equipment Utilized During Treatment: Gait belt;Other (comment) (Left knee brace, locked in extension) Activity Tolerance: Patient tolerated treatment well;Patient limited by pain Patient left: in bed;with call bell/phone within reach;with family/visitor present;with bed alarm set Nurse Communication: Mobility status PT Visit Diagnosis: Muscle weakness (generalized) (M62.81);Difficulty in walking, not elsewhere classified (R26.2);Pain Pain - Right/Left: Left Pain - part of body: Leg     Time: 1215-1237 PT Time Calculation (min) (ACUTE ONLY): 22 min  Charges:  $Gait Training: 8-22 mins                     Citlali Gautney, PT, GCS 06/09/20,12:55 PM

## 2020-06-09 NOTE — Evaluation (Addendum)
Occupational Therapy Evaluation Patient Details Name: Rodney Pittman MRN: [id number] DOB: Jan 24, 1971 Today's Date: 06/09/2020    History of Present Illness Rodney Pittman is a 49 y.o. male.  He is a level 2 trauma.  He was working on a billboard standing on a ladder 20 feet up in the air when the wind blew and the patient fell approximately 20 feet to the ground striking his left knee on a hollow pipe that was sticking out.  Open injury to left knee.   Clinical Impression   Pt admitted with the above diagnoses and presents with below problem list. Pt will benefit from continued acute OT to address the below listed deficits and maximize independence with basic ADLs prior to d/c home. PTA pt was independent with ADLs. Pt limited by 9/10 pain this session. Just finished walking a short distance in the room with PT. Pt reporting 9/10 pain and very fatigued. Discussed possibility of holding d/c until tomorrow to get pain more under control and be able to tolerate functional transfers/mobility better. Pt currently min A with LB ADLs and functional transfers.     Follow Up Recommendations  Home health OT;Supervision/Assistance - 24 hour (inital 24 hour)    Equipment Recommendations  3 in 1 bedside commode    Recommendations for Other Services       Precautions / Restrictions Precautions Precautions: Fall Restrictions Weight Bearing Restrictions: No Other Position/Activity Restrictions: WBAT L LE      Mobility Bed Mobility Overal bed mobility: Needs Assistance Bed Mobility: Supine to Sit;Sit to Supine     Supine to sit: Min assist Sit to supine: Min assist   General bed mobility comments: assist needed to raise trunk to seated position and to bring L LE off bed.  Transfers Overall transfer level: Needs assistance Equipment used: Rolling walker (2 wheeled) Transfers: Sit to/from Stand Sit to Stand: Min assist         General transfer comment: cues for hand placement,  likes to pull up on wife.    Balance Overall balance assessment: Needs assistance Sitting-balance support: Feet supported Sitting balance-Leahy Scale: Good     Standing balance support: Bilateral upper extremity supported;During functional activity Standing balance-Leahy Scale: Fair Standing balance comment: reliant on RW due to pain in left LE                           ADL either performed or assessed with clinical judgement   ADL Overall ADL's : Needs assistance/impaired Eating/Feeding: Set up;Sitting   Grooming: Set up;Sitting   Upper Body Bathing: Set up;Sitting   Lower Body Bathing: Minimal assistance;Sit to/from stand   Upper Body Dressing : Set up;Sitting   Lower Body Dressing: Minimal assistance;Sit to/from stand   Toilet Transfer: Minimal assistance;Ambulation;BSC;RW   Toileting- Clothing Manipulation and Hygiene: Minimal assistance;Sit to/from stand       Functional mobility during ADLs: Min guard;Rolling walker General ADL Comments: Assist level per clinical judgment an discussion with PT. Pt limited by 9/10 pain at start of OT session. Ultimately bed level eval.     Vision         Perception     Praxis      Pertinent Vitals/Pain Pain Assessment: 0-10 Pain Score: 9  Pain Location: Left knee Pain Descriptors / Indicators: Discomfort;Grimacing;Guarding;Sore Pain Intervention(s): Monitored during session;Limited activity within patient's tolerance;Repositioned;Patient requesting pain meds-RN notified;Ice applied     Hand Dominance     Extremity/Trunk Assessment  Upper Extremity Assessment Upper Extremity Assessment: Overall WFL for tasks assessed   Lower Extremity Assessment Lower Extremity Assessment: Defer to PT evaluation       Communication Communication Communication: Prefers language other than [place]; Utilized video interpretive services.    Cognition Arousal/Alertness: Awake/alert Behavior During Therapy: WFL for tasks  assessed/performed Overall Cognitive Status: Within Functional Limits for tasks assessed                                     General Comments       Exercises     Shoulder Instructions      Home Living Family/patient expects to be discharged to:: Private residence Living Arrangements: Spouse/significant other Available Help at Discharge: Family;Available 24 hours/day Type of Home: House Home Access: Stairs to enter [employer] of Steps: 3 Entrance Stairs-Rails: Right;Left;Can reach both Home Layout: One level     Bathroom Shower/Tub: Runner, broadcasting/film/video: None          Prior Functioning/Environment Level of Independence: Independent        Comments: working, fully independent        OT Problem List: Decreased activity tolerance;Impaired balance (sitting and/or standing);Decreased knowledge of use of DME or AE;Decreased knowledge of precautions;Pain      OT Treatment/Interventions: Self-care/ADL training;DME and/or AE instruction;Therapeutic activities;Patient/family education;Balance training    OT Goals(Current goals can be found in the care plan section) Acute Rehab OT Goals Patient Stated Goal: to return home OT Goal Formulation: With patient/family Time For Goal Achievement: 06/23/20 Potential to Achieve Goals: Good ADL Goals Pt Will Perform Lower Body Bathing: with modified independence;sit to/from stand Pt Will Perform Lower Body Dressing: with modified independence;sit to/from stand Pt Will Transfer to Toilet: with modified independence;ambulating Pt Will Perform Toileting - Clothing Manipulation and hygiene: with modified independence;sit to/from stand Pt Will Perform Tub/Shower Transfer: with supervision;ambulating;shower seat;3 in 1;rolling walker;Shower transfer  OT Frequency: Min 2X/week   Barriers to D/C:            Co-evaluation              AM-PAC OT "6 Clicks" Daily Activity      Outcome Measure Help from another person eating meals?: None Help from another person taking care of personal grooming?: None Help from another person toileting, which includes using toliet, bedpan, or urinal?: A Little Help from another person bathing (including washing, rinsing, drying)?: A Little Help from another person to put on and taking off regular upper body clothing?: None Help from another person to put on and taking off regular lower body clothing?: A Little 6 Click Score: 21   End of Session Equipment Utilized During Treatment: Left knee immobilizer Nurse Communication: Patient requests pain meds;Other (comment) (rec holding d/c until tomorrow 2/2 pain)  Activity Tolerance: Patient limited by pain Patient left: in bed;with call bell/phone within reach;with family/visitor present  OT Visit Diagnosis: Other abnormalities of gait and mobility (R26.89);Pain Pain - Right/Left: Left Pain - part of body: Knee;Leg                Time: 1235-1250 OT Time Calculation (min): 15 min Charges:  OT General Charges $OT Visit: 1 Visit OT Evaluation $OT Eval Low Complexity: 1 Low  Raynald Kemp, OT Acute Rehabilitation Services Pager: [phone] Office: [phone]   Pilar Grammes 06/09/2020, 1:19 PM

## 2020-06-09 NOTE — Plan of Care (Signed)
  Problem: Education: Goal: Knowledge of General Education information will improve Description: Including pain rating scale, medication(s)/side effects and non-pharmacologic comfort measures Outcome: Progressing   Problem: Health Behavior/Discharge Planning: Goal: Ability to manage health-related needs will improve Outcome: Progressing   Problem: Clinical Measurements: Goal: Will remain free from infection Outcome: Progressing   

## 2020-06-09 NOTE — TOC Transition Note (Signed)
Transition of Care [facility]) - CM/SW Discharge Note   Patient Details  Name: Rodney Pittman MRN: [id number] Date of Birth: [birth year]  Transition of Care [facility]) CM/SW Contact:  Kermit Balo, RN Phone Number: 06/09/2020, 12:31 PM   Clinical Narrative:    Plan is for patient to d/c home with charity physical HH services through [facility]. CM spoke with Rosey Bath at [facility] and she accepted the referral.  [employer] to deliver charity walker to the room.  CM was able to get PCP appt at [facility]. Information on the AVS. Also information for [facility] pharmacy on AVS.  Wife to provide supervision at home and transportation to home.    Final next level of care: Home w Home Health Services Barriers to Discharge: No Barriers Identified   Patient Goals and CMS Choice   CMS Medicare.gov Compare Post Acute Care list provided to:: Patient Choice offered to / list presented to : Patient, Spouse  Discharge Placement                       Discharge Plan and Services                DME Arranged: Walker rolling DME Agency: [employer] Date DME Agency Contacted: 06/09/20   Representative spoke with at DME Agency: Velna Hatchet HH Arranged: PT HH Agency: [facility] (formerly [facility]) Date HH Agency Contacted: 06/09/20   Representative spoke with at [facility] Agency: Teresa--charity services  Social Determinants of Health (SDOH) Interventions     Readmission Risk Interventions No flowsheet data found.

## 2020-06-09 NOTE — Progress Notes (Addendum)
Orthopaedic Trauma Service Progress Note  Patient ID: Rodney Pittman MRN: [id number] DOB/AGE: [birth year] 49 y.o.  Subjective:  Doing very well Pain controlled Mobilizing well with therapies Wants to go home today   No other complaints   ROS As above  Objective:   VITALS:   Vitals:   06/08/20 1804 06/08/20 2004 06/09/20 0556 06/09/20 0831  BP:  135/84 133/73 128/82  Pulse:  (!) 103 98 96  Resp:  20 20 20   Temp: 98.6 F (37 C) 98.4 F (36.9 C) 98.2 F (36.8 C) 98 F (36.7 C)  TempSrc: Oral   Oral  SpO2:  92% 94% 95%    There is no height or weight on file to calculate BMI.   Intake/Output      09/21 0701 - 09/22 0700 09/22 0701 - 09/23 0700   P.O.     I.V. 975.9    IV Piggyback 100    Total Intake 1075.9    Urine     Drains 0    Blood     Total Output 0    Net +1075.9           LABS  Results for orders placed or performed during the hospital encounter of 06/07/20 (from the past 24 hour(s))  Basic metabolic panel     Status: Abnormal   Collection Time: 06/09/20  5:05 AM  Result Value Ref Range   Sodium 137 135 - 145 mmol/L   Potassium 4.0 3.5 - 5.1 mmol/L   Chloride 103 98 - 111 mmol/L   CO2 26 22 - 32 mmol/L   Glucose, Bld 244 (H) 70 - 99 mg/dL   BUN 6 6 - 20 mg/dL   Creatinine, Ser 06/11/20 0.61 - 1.24 mg/dL   Calcium 8.5 (L) 8.9 - 10.3 mg/dL   GFR calc non Af Amer >60 >60 mL/min   GFR calc Af Amer >60 >60 mL/min   Anion gap 8 5 - 15  Hemoglobin A1c     Status: Abnormal   Collection Time: 06/09/20  5:05 AM  Result Value Ref Range   Hgb A1c MFr Bld 6.8 (H) 4.8 - 5.6 %   Mean Plasma Glucose 148.46 mg/dL  CBC     Status: Abnormal   Collection Time: 06/09/20  5:05 AM  Result Value Ref Range   WBC 7.0 4.0 - 10.5 K/uL   RBC 3.86 (L) 4.22 - 5.81 MIL/uL   Hemoglobin 11.2 (L) 13.0 - 17.0 g/dL   HCT 06/11/20 (L) 39 - 52 %   MCV 88.3 80.0 - 100.0 fL   MCH 29.0 26.0 - 34.0 pg    MCHC 32.8 30.0 - 36.0 g/dL   RDW 28.3 15.1 - 76.1 %   Platelets 175 150 - 400 K/uL   nRBC 0.0 0.0 - 0.2 %     PHYSICAL EXAM:   Gen: resting comfortably in bed, NAD, appears well  Lungs: unlabored  Cardiac: RRR Abd: + BS, NTND Pelvis: no instability  Ext:                 Left Lower Extremity                          Dressing c/d/i    Vac removed    Traumatic  wound looks great    Penrose removed from medial knee w/o difficulty     Adaptic, 4x4, abd, kerlix and ace reapplied    Hinged brace fitting well and is locked in full extension                          Ext warm    Ecchymosis stable to left lower leg                          Swelling stable                         + DP pulse                         VAC functioning well, good seal                          DPN, SPN, TN sensation intact                         EHL, FHL, lesser toe motor intact                         Ankle flexion, extension, inversion and eversion intact                          Compartments are soft, no pain with passive stretch     Assessment/Plan: 2 Days Post-Op   Active Problems:   Fall   Open comminuted fracture of left patella   Nicotine dependence   Anti-infectives (From admission, onward)   Start     Dose/Rate Route Frequency Ordered Stop   06/08/20 1600  cefTRIAXone (ROCEPHIN) 2 g in sodium chloride 0.9 % 100 mL IVPB       "And" Linked Group Details   2 g 200 mL/hr over 30 Minutes Intravenous Every 24 hours 06/08/20 0026 06/11/20 1559   06/08/20 0200  metroNIDAZOLE (FLAGYL) IVPB 500 mg       "And" Linked Group Details   500 mg 100 mL/hr over 60 Minutes Intravenous Every 8 hours 06/08/20 0026 06/11/20 0159   06/07/20 1700  cefTRIAXone (ROCEPHIN) 2 g in sodium chloride 0.9 % 100 mL IVPB        2 g 200 mL/hr over 30 Minutes Intravenous  Once 06/07/20 1647 06/07/20 1833   06/07/20 1700  vancomycin (VANCOCIN) IVPB 1000 mg/200 mL premix        1,000 mg 200 mL/hr over 60 Minutes  Intravenous  Once 06/07/20 1647 06/07/20 1944    .  POD/HD#: 2  49 y/o male s/p fall off ladder with open L patella fracture    -grade 3 open L patella fracture s/p I&D and ORIF, patellar tendon repair (inferomedial fragment missing)             WBAT L leg with knee in full extension and brace on              No knee ROM x 2 weeks              No active knee extension x 8 weeks             hinged knee brace, locked in full extension at all  times except for hygiene               PT/OT prior to dc today               Ice and elevate             Ankle and toe motion as tolerated                Dressing changed today, can change again on 06/11/2020   Reviewed wound care with wife and pt   - R lateral malleolus avulsion fracture  Non-op   Symptom management  Air cast if pt desires  WBAT    - Pain management:             Continue with current regimen              Multimodal    - ABL anemia/Hemodynamics             Stable   - Medical issues              no reported medical issues   Obtained a1c due to elevated sugars on CMET   a1c diagnostic for DM    Will need PCP   TOC please make appointment for [facility]     Uncontrolled blood sugars increases post op complication profile including infection and nonunion    DM coordinator consult prior to dc as well    - DVT/PE prophylaxis:             lovenox x 14 days   - ID:              dc home after last dose of rocephin today.  Pt would have had 72 hours of abx coverage    - Metabolic Bone Disease:             Vitamin d levels are reasonable    Will place on vitamin d supplements given new dx of DM     - Activity:             As above   - FEN/GI prophylaxis/Foley/Lines:             Reg diet   - Impediments to fracture healing:             Open fracture   DM    - Dispo:             home today after last dose of rocephin   Will have meds filled at [facility] pharmacy    Mearl Latin, PA-C [phone]  (C) 06/09/2020, 10:23 AM  [facility] [address] [place] [address] [phone] Collier Bullock (F)

## 2020-06-09 NOTE — Discharge Summary (Signed)
[facility]edic [facility]uma Service (OTS) Discharge Summary   Patient ID: Rodney Pittman MRN: [id number] DOB/AGE: 47-Jul-1972 47 y.o.  Admit date: 06/07/2020 Discharge date: 06/10/2020  Admission Diagnoses: Fall off ladder- 22 feet Open comminuted left patella fracture Nicotine dependence Avulsion fracture right lateral malleolus  Discharge Diagnoses:  Principal Problem:   Open comminuted fracture of left patella Active Problems:   Fall from height of greater than 3 feet   Nicotine dependence   Diabetes (HCC)   Avulsion fracture of lateral malleolus of right fibula   Open wound of knee, complicated   Past Medical History:  Diagnosis Date   Avulsion fracture of lateral malleolus of right fibula 06/09/2020   Diabetes (HCC) 06/09/2020   Fall 06/07/2020   FALL FROM LADDER    Open wound of knee, complicated 06/09/2020     Procedures Performed: 06/07/2020-Dr. Carola Frost  Irrigation debridement left knee Traumatic arthrotomy left knee ORIF left patella  Discharged Condition: good  Hospital Course:   Patient very pleasant 49 year old male who sustained Pittman fall approximately 22 feet after he fell off of Pittman ladder.  Patient was brought to [facility] as Pittman nontrauma activation.  He was found to have an open left patella fracture as well as Pittman right fibular avulsion fracture patient did receive IV antibiotics in the ED along with tetanus booster.  He was taken emergently to the operating room for thorough irrigation debridement and fixation of his left patella.  Patient tolerated the procedure well.  After surgery was transferred to PACU for recovery from anesthesia and then transferred to the orthopedic floor for observation, pain control and therapies.  Patient did very well over the course of his hospital stay.  We kept him specifically for IV antibiotics given the contamination factor.  Patient did not have any issues during his hospital stay.  He was started on Lovenox for  DVT and PE prophylaxis on postoperative day #1.  His antibiotics included Rocephin and Flagyl given the degree of contamination in the size of the wound.  He did have Pittman traumatic arthrotomy to his left knee as well.  Incisional wound VAC was applied in the OR over [place] as well.  This was removed on postoperative day #2 and Pittman dry dressing was applied.  He was fitted for his hinged knee brace on postoperative day #1 but it was left in Pittman locked position.  Patient will be restricted from knee range of motion for the next 2 weeks and then will begin some graduated rated range of motion exercises.  He will be restricted from active knee extension for 8 weeks.  Patient was discharged in stable condition on 06/11/2019 after his final doses of IV antibiotics.  He will be discharged on Lovenox for DVT prophylaxis for the next 14 days as well.  His vitamin D levels look okay.  But he will be started on supplementation mainly because of his new diagnosis of diabetes.  During his hospital stay his blood sugars on routine labs was noted to be consistently elevated did obtain an A1c which was notable for 6.8%.  Prior to discharge he did have Pittman consult with the diabetes coordinator and record is were made.  He will need to establish care with Pittman primary care physician to follow this long-term.  It is vital to get good control of blood sugars so as to decrease his risk for infection and to decrease the risk of nonunion of his fracture.  Patient is weightbearing as tolerated on  his left leg with his knee in full extension in his hinged knee brace on and locked in full extension.  Unrestricted range of motion of his left ankle and hip He is weightbearing as tolerated on his right ankle and he may use an Aircast for his comfort otherwise he does not require any formal bracing.  Unrestricted range of motion of his ankle on the right   Consults: None  Significant Diagnostic Studies: labs:   Results for Rodney Pittman, Rodney Pittman  (MRN [id number]) as of 06/09/2020 12:21  Ref. Range 06/08/2020 01:51 06/09/2020 05:05  BASIC METABOLIC PANEL Unknown Rpt (Pittman) Rpt (Pittman)  Sodium Latest Ref Range: 135 - 145 mmol/L 136 137  Potassium Latest Ref Range: 3.5 - 5.1 mmol/L 4.0 4.0  Chloride Latest Ref Range: 98 - 111 mmol/L 103 103  CO2 Latest Ref Range: 22 - 32 mmol/L 26 26  Glucose Latest Ref Range: 70 - 99 mg/dL 782220 (H) 956244 (H)  Mean Plasma Glucose Latest Units: mg/dL  213.08148.46  BUN Latest Ref Range: 6 - 20 mg/dL 9 6  Creatinine Latest Ref Range: 0.61 - 1.24 mg/dL 6.570.68 8.460.73  Calcium Latest Ref Range: 8.9 - 10.3 mg/dL 8.4 (L) 8.5 (L)  Anion gap Latest Ref Range: 5 - 15  7 8   GFR, Est Non African American Latest Ref Range: >60 mL/min >60 >60  GFR, Est African American Latest Ref Range: >60 mL/min >60 >60  Vitamin D, 25-Hydroxy Latest Ref Range: 30 - 100 ng/mL 30.99   WBC Latest Ref Range: 4.0 - 10.5 K/uL 9.1 7.0  RBC Latest Ref Range: 4.22 - 5.81 MIL/uL 4.18 (L) 3.86 (L)  Hemoglobin Latest Ref Range: 13.0 - 17.0 g/dL 96.212.0 (L) 95.211.2 (L)  HCT Latest Ref Range: 39 - 52 % 35.9 (L) 34.1 (L)  MCV Latest Ref Range: 80.0 - 100.0 fL 85.9 88.3  MCH Latest Ref Range: 26.0 - 34.0 pg 28.7 29.0  MCHC Latest Ref Range: 30.0 - 36.0 g/dL 84.133.4 32.432.8  RDW Latest Ref Range: 11.5 - 15.5 % 11.4 (L) 11.6  Platelets Latest Ref Range: 150 - 400 K/uL 195 175  nRBC Latest Ref Range: 0.0 - 0.2 % 0.0 0.0  Hemoglobin A1C Latest Ref Range: 4.8 - 5.6 %  6.8 (H)    Treatments: IV hydration, antibiotics: ceftriaxone and metronidazole, analgesia: acetaminophen and oxycodone, anticoagulation: LMW heparin, therapies: PT, OT and RN and surgery: As above  Discharge Exam:                                             Patient ID: Rodney Ranavan Pittman Pittman MRN: [id number] DOB/AGE: [birth year] 49 y.o.  Subjective:  Doing well  Ready to go home  Pain improved    ROS As above  Objective:   VITALS:   Vitals:   06/09/20 2138 06/10/20 0535 06/10/20 1427 06/10/20 1451    BP: (!) 147/74 110/73 (!) 142/88   Pulse: 93 78 86 (!) 103  Resp: 19 17 17    Temp: 98.9 F (37.2 C) 97.8 F (36.6 C) 98 F (36.7 C)   TempSrc: Oral Oral Oral   SpO2: 95% 97% 98% 95%    There is no height or weight on file to calculate BMI.   Intake/Output      09/22 0701 - 09/23 0700 09/23 0701 - 09/24 0700   P.O.  120   I.V.  IV Piggyback     Total Intake  120   Drains     Total Output     Net  +120        Urine Occurrence  3 x     LABS  Results for orders placed or performed during the hospital encounter of 06/07/20 (from the past 24 hour(s))  Basic metabolic panel     Status: Abnormal   Collection Time: 06/10/20  1:44 AM  Result Value Ref Range   Sodium 138 135 - 145 mmol/L   Potassium 3.7 3.5 - 5.1 mmol/L   Chloride 102 98 - 111 mmol/L   CO2 27 22 - 32 mmol/L   Glucose, Bld 123 (H) 70 - 99 mg/dL   BUN 6 6 - 20 mg/dL   Creatinine, Ser 9.60 (L) 0.61 - 1.24 mg/dL   Calcium 8.7 (L) 8.9 - 10.3 mg/dL   GFR calc non Af Amer >60 >60 mL/min   GFR calc Af Amer >60 >60 mL/min   Anion gap 9 5 - 15     PHYSICAL EXAM:    Gen: resting comfortably in bed, NAD, appears well  Lungs: unlabored  Cardiac: RRR Abd: + BS, NTND Pelvis: no instability  Ext:                 Left Lower Extremity                          Dressing c/d/i                         Ext warm                          Ecchymosis stable to left lower leg                          Swelling stable                         + DP pulse                         DPN, SPN, TN sensation intact                         EHL, FHL, lesser toe motor intact                         Ankle flexion, extension, inversion and eversion intact                          Compartments are soft, no pain with passive stretch      Mearl Latin, PA-C [phone] (C) 06/10/2020, 6:21 PM  [facility] [address] [place] [address] [phone] Collier Bullock (F)       Disposition:  Discharge disposition: 01-Home or Self Care       Discharge Instructions    Amb Referral to Nutrition and Diabetic E   Complete by: As directed    Call MD / Call 911   Complete by: As directed    If you experience chest pain or shortness of breath, CALL 911 and be transported to the hospital emergency room.  If you develope Pittman  fever above 101 F, pus (white drainage) or increased drainage or redness at the wound, or calf pain, call your surgeon's office.   Constipation Prevention   Complete by: As directed    Drink plenty of fluids.  Prune juice may be helpful.  You may use Pittman stool softener, such as Colace (over the counter) 100 mg twice Pittman day.  Use MiraLax (over the counter) for constipation as needed.   Diet Carb Modified   Complete by: As directed    Discharge instructions   Complete by: As directed    Orthopaedic Trauma Service Discharge Instructions   General Discharge Instructions  Orthopaedic Injuries:  Open left patella fracture treated with irrigation debridement and open reduction internal fixation using screws              Avulsion fracture to right distal fibula (ankle).  Nonoperative treatment  WEIGHT BEARING STATUS: Weight-bear as tolerated left leg with brace on and knee in full extension (fully straight) Weight-bear as tolerated right ankle with Aircast on if so desired.  Do not have to wear Pittman cast.  It is for your comfort  RANGE OF MOTION/ACTIVITY: No knee range of motion at this time.  Keep knee straight at all times.  unrestricted range of motion of right ankle  Bone health: Vitamin D levels are low normal.  Recommend supplementation to help with healing  Wound Care: Daily wound care starting on 06/11/2020.  Please see below.  Other: It appears that you do have diabetes based on lab findings during your hospitalization.  You need to establish care with Pittman primary care physician to monitor this and address this.  It is important to get your diabetes under control  to enable your fracture the best opportunity to heal without problems  Discharge Wound Care Instructions  Do NOT apply any ointments, solutions or lotions to pin sites or surgical wounds.  These prevent needed drainage and even though solutions like hydrogen peroxide kill bacteria, they also damage cells lining the pin sites that help fight infection.  Applying lotions or ointments can keep the wounds moist and can cause them to breakdown and open up as well. This can increase the risk for infection. When in doubt call the office.  Surgical incisions should be dressed daily.  If any drainage is noted, use one layer of adaptic, then gauze, Kerlix, and an ace wrap.  Once the incision is completely dry and without drainage, it may be left open to air out.  Showering may begin 36-48 hours later.  Cleaning gently with soap and water.  Traumatic wounds should be dressed daily as well.    One layer of adaptic, gauze, Kerlix, then ace wrap.  The adaptic can be discontinued once the draining has ceased    If you have Pittman wet to dry dressing: wet the gauze with saline the squeeze as much saline out so the gauze is moist (not soaking wet), place moistened gauze over wound, then place Pittman dry gauze over the moist one, followed by Kerlix wrap, then ace wrap.   DVT/PE prophylaxis: Lovenox injection daily x 14 days  Diet: as you were eating previously.  Can use over the counter stool softeners and bowel preparations, such as Miralax, to help with bowel movements.  Narcotics can be constipating.  Be sure to drink plenty of fluids  PAIN MEDICATION USE AND EXPECTATIONS  You have likely been given narcotic medications to help control your pain.  After Pittman traumatic event that results  in an fracture (broken bone) with or without surgery, it is ok to use narcotic pain medications to help control one's pain.  We understand that everyone responds to pain differently and each individual patient will be evaluated on Pittman  regular basis for the continued need for narcotic medications. Ideally, narcotic medication use should last no more than 6-8 weeks (coinciding with fracture healing).   As Pittman patient it is your responsibility as well to monitor narcotic medication use and report the amount and frequency you use these medications when you come to your office visit.   We would also advise that if you are using narcotic medications, you should take Pittman dose prior to therapy to maximize you participation.  IF YOU ARE ON NARCOTIC MEDICATIONS IT IS NOT PERMISSIBLE TO OPERATE Pittman MOTOR VEHICLE (MOTORCYCLE/CAR/TRUCK/MOPED) OR HEAVY MACHINERY DO NOT MIX NARCOTICS WITH OTHER CNS (CENTRAL NERVOUS SYSTEM) DEPRESSANTS SUCH AS ALCOHOL   STOP SMOKING OR USING NICOTINE PRODUCTS!!!!  As discussed nicotine severely impairs your body's ability to heal surgical and traumatic wounds but also impairs bone healing.  Wounds and bone heal by forming microscopic blood vessels (angiogenesis) and nicotine is Pittman vasoconstrictor (essentially, shrinks blood vessels).  Therefore, if vasoconstriction occurs to these microscopic blood vessels they essentially disappear and are unable to deliver necessary nutrients to the healing tissue.  This is one modifiable factor that you can do to dramatically increase your chances of healing your injury.    (This means no smoking, no nicotine gum, patches, etc)  DO NOT USE NONSTEROIDAL ANTI-INFLAMMATORY DRUGS (NSAID'S)  Using products such as Advil (ibuprofen), Aleve (naproxen), Motrin (ibuprofen) for additional pain control during fracture healing can delay and/or prevent the healing response.  If you would like to take over the counter (OTC) medication, Tylenol (acetaminophen) is ok.  However, some narcotic medications that are given for pain control contain acetaminophen as well. Therefore, you should not exceed more than 4000 mg of tylenol in Pittman day if you do not have liver disease.  Also note that there are may OTC  medicines, such as cold medicines and allergy medicines that my contain tylenol as well.  If you have any questions about medications and/or interactions please ask your doctor/PA or your pharmacist.      ICE AND ELEVATE INJURED/OPERATIVE EXTREMITY  Using ice and elevating the injured extremity above your heart can help with swelling and pain control.  Icing in Pittman pulsatile fashion, such as 20 minutes on and 20 minutes off, can be followed.    Do not place ice directly on skin. Make sure there is Pittman barrier between to skin and the ice pack.    Using frozen items such as frozen peas works well as the conform nicely to the are that needs to be iced.  USE AN ACE WRAP OR TED HOSE FOR SWELLING CONTROL  In addition to icing and elevation, Ace wraps or TED hose are used to help limit and resolve swelling.  It is recommended to use Ace wraps or TED hose until you are informed to stop.    When using Ace Wraps start the wrapping distally (farthest away from the body) and wrap proximally (closer to the body)   Example: If you had surgery on your leg or thing and you do not have Pittman splint on, start the ace wrap at the toes and work your way up to the thigh        If you had surgery on your upper extremity and  do not have Pittman splint on, start the ace wrap at your fingers and work your way up to the upper arm  IF YOU ARE IN Pittman SPLINT OR CAST DO NOT REMOVE IT FOR ANY REASON   If your splint gets wet for any reason please contact the office immediately. You may shower in your splint or cast as long as you keep it dry.  This can be done by wrapping in Pittman cast cover or garbage back (or similar)  Do Not stick any thing down your splint or cast such as pencils, money, or hangers to try and scratch yourself with.  If you feel itchy take benadryl as prescribed on the bottle for itching  IF YOU ARE IN Pittman CAM BOOT (BLACK BOOT)  You may remove boot periodically. Perform daily dressing changes as noted below.  Wash the liner of  the boot regularly and wear Pittman sock when wearing the boot. It is recommended that you sleep in the boot until told otherwise    Call office for the following: Temperature greater than 101F Persistent nausea and vomiting Severe uncontrolled pain Redness, tenderness, or signs of infection (pain, swelling, redness, odor or green/yellow discharge around the site) Difficulty breathing, headache or visual disturbances Hives Persistent dizziness or light-headedness Extreme fatigue Any other questions or concerns you may have after discharge  In an emergency, call 911 or go to an Emergency Department at Pittman nearby hospital  HELPFUL INFORMATION  If you had Pittman block, it will wear off between 8-24 hrs postop typically.  This is period when your pain may go from nearly zero to the pain you would have had postop without the block.  This is an abrupt transition but nothing dangerous is happening.  You may take an extra dose of narcotic when this happens.  You should wean off your narcotic medicines as soon as you are able.  Most patients will be off or using minimal narcotics before their first postop appointment.   We suggest you use the pain medication the first night prior to going to bed, in order to ease any pain when the anesthesia wears off. You should avoid taking pain medications on an empty stomach as it will make you nauseous.  Do not drink alcoholic beverages or take illicit drugs when taking pain medications.  In most states it is against the law to drive while you are in Pittman splint or sling.  And certainly against the law to drive while taking narcotics.  You may return to work/school in the next couple of days when you feel up to it.   Pain medication may make you constipated.  Below are Pittman few solutions to try in this order: Decrease the amount of pain medication if you aren't having pain. Drink lots of decaffeinated fluids. Drink prune juice and/or each dried prunes  If the first 3  don't work start with additional solutions Take Colace - an over-the-counter stool softener Take Senokot - an over-the-counter laxative Take Miralax - Pittman stronger over-the-counter laxative     CALL THE OFFICE WITH ANY QUESTIONS OR CONCERNS: [phone]   VISIT OUR WEBSITE FOR ADDITIONAL INFORMATION: [email or web address]   Driving restrictions   Complete by: As directed    No driving until further notice   Increase activity slowly as tolerated   Complete by: As directed    Weight bearing as tolerated   Complete by: As directed    Weight-bear as tolerated left leg with knee in full extension at  brace on Air cast right ankle is for your comfort   Laterality: bilateral   Extremity: Lower     Allergies as of 06/10/2020   No Known Allergies     Medication List    TAKE these medications   acetaminophen 500 MG tablet Commonly known as: TYLENOL Take 1 tablet (500 mg total) by mouth every 12 (twelve) hours.   ascorbic acid 500 MG tablet Commonly known as: VITAMIN C Take 2 tablets (1,000 mg total) by mouth daily.   docusate sodium 100 MG capsule Commonly known as: COLACE Take 1 capsule (100 mg total) by mouth 2 (two) times daily.   enoxaparin 40 MG/0.4ML injection Commonly known as: LOVENOX Inject 0.4 mLs (40 mg total) into the skin daily for 14 days.   meloxicam 7.5 MG tablet Commonly known as: MOBIC Take 1 tablet (7.5 mg total) by mouth daily for 5 days. Start taking on: June 11, 2020   methocarbamol 500 MG tablet Commonly known as: ROBAXIN Take 1-2 tablets (500-1,000 mg total) by mouth every 6 (six) hours as needed for muscle spasms.   multivitamin with minerals Tabs tablet Take 1 tablet by mouth daily.   oxyCODONE-acetaminophen 5-325 MG tablet Commonly known as: Percocet Take 1-2 tablets by mouth every 6 (six) hours as needed for moderate pain or severe pain.   polyethylene glycol 17 g packet Commonly known as: MIRALAX / GLYCOLAX Take 17 g by mouth daily.    traMADol 50 MG tablet Commonly known as: Ultram Take 1 tablet (50 mg total) by mouth every 8 (eight) hours as needed for moderate pain.   Vitamin D 125 MCG (5000 UT) Caps Take 1 capsule by mouth daily.            Durable Medical Equipment  (From admission, onward)         Start     Ordered   06/09/20 1215  For home use only DME Walker rolling  Once       Question Answer Comment  Walker: With 5 Inch Wheels   Patient needs Pittman walker to treat with the following condition S/P left knee surgery      06/09/20 1214           Discharge Care Instructions  (From admission, onward)         Start     Ordered   06/10/20 0000  Weight bearing as tolerated       Comments: Weight-bear as tolerated left leg with knee in full extension at brace on Air cast right ankle is for your comfort  Question Answer Comment  Laterality bilateral   Extremity Lower      06/10/20 1815          Follow-up Information    [name], MD. Schedule an appointment as soon as possible for Pittman visit in 2 week(s).   Specialty: Orthopedic Surgery Contact information: [address] [place] [address] [phone]        [facility] Follow up on 06/18/2020.   Why: Your appointment is at 9:30 am. Please arrive early and bring Pittman picture ID and your current medications Contact information: [address] [place] [address] [phone]       [facility] Follow up.   Specialty: Home Health Services Why: The home health agency will contact you for the first home visit. Contact information: [address] [place] [address] [phone]  [facility] Follow up.   Why: Please use this location for your pharmacy needs. they will assist with the cost of medications. Contact information: [address] [place] [address] [phone]               Discharge Instructions and Plan:  49 year old male fall approximately 22 feet with open left patella fracture and right ankle fibular avulsion fracture  Weightbearing: Weight-bear as tolerated left leg with knee in full extension and hinged brace on and locked out in full extension.  Weight-bear as tolerated right leg Aircast for comfort . Insicional and dressing care: Daily dressing changes with Adaptic, 4 x 4's, Kerlix and Ace wrap starting on 06/11/2020 Orthopedic device(s): Pittman walker, Aircast and hinged knee brace Showering: Okay to shower once wounds are dry.  Do not apply any ointments lotions or solutions.  Clean only with soap and water VTE prophylaxis: Lovenox 40mg  qd x 14 days Pain control: Multimodal: Tylenol, Robaxin and Percocet Bone Health/Optimization: Recommend taking vitamin D supplementations.  These have been prescribed for you.  These will help with bone healing Follow - up plan: 10 to 14 days Contact information:  [name] MD, Montez Morita PA-C   Signed:  Mearl Latin, PA-C [phone] Salena Saner) 06/10/2020, 6:21 PM  [facility] [address] [place] [address] [phone] (F)

## 2020-06-09 NOTE — TOC CAGE-AID Note (Signed)
Transition of Care [facility]) - CAGE-AID Screening   Patient Details  Name: Rodney Pittman MRN: [id number] Date of Birth: 1971/01/01  Transition of Care [facility]) CM/SW Contact:    Jimmy Picket, [place] Phone Number: 06/09/2020, 1:23 PM   Clinical Narrative:  CSW called interpreter services and spoke with interpreter [phone]. Interpreter introduced CSW to pt. Pt stated he has alcohol rarely. Pt denies substance use. Pt did not need resources at this time.   CAGE-AID Screening:    Have You Ever Felt You Ought to Cut Down on Your Drinking or Drug Use?: No Have People Annoyed You By Critizing Your Drinking Or Drug Use?: No Have You Felt Bad Or Guilty About Your Drinking Or Drug Use?: No Have You Ever Had a Drink or Used Drugs First Thing In The Morning to Steady Your Nerves or to Get Rid of a Hangover?: No CAGE-AID Score: 0  Substance Abuse Education Offered: Yes    Denita Lung, Bridget Hartshorn Clinical Social Worker [phone]

## 2020-06-10 LAB — BASIC METABOLIC PANEL
Anion gap: 9 (ref 5–15)
BUN: 6 mg/dL (ref 6–20)
CO2: 27 mmol/L (ref 22–32)
Calcium: 8.7 mg/dL — ABNORMAL LOW (ref 8.9–10.3)
Chloride: 102 mmol/L (ref 98–111)
Creatinine, Ser: 0.6 mg/dL — ABNORMAL LOW (ref 0.61–1.24)
GFR calc Af Amer: >60 mL/min (ref >60)
GFR calc non Af Amer: >60 mL/min (ref >60)
Glucose, Bld: 123 mg/dL — ABNORMAL HIGH (ref 70–99)
Potassium: 3.7 mmol/L (ref 3.5–5.1)
Sodium: 138 mmol/L (ref 135–145)

## 2020-06-10 MED ORDER — MELOXICAM 7.5 MG PO TABS
7.5000 mg | ORAL_TABLET | Freq: Every day | ORAL | Status: DC
  Administered 2020-06-10: 7.5 mg via ORAL
  Filled 2020-06-10: qty 1

## 2020-06-10 MED ORDER — ACETAMINOPHEN 500 MG PO TABS
1000.0000 mg | ORAL_TABLET | Freq: Three times a day (TID) | ORAL | Status: DC
  Administered 2020-06-10: 1000 mg via ORAL
  Filled 2020-06-10: qty 2

## 2020-06-10 MED ORDER — MELOXICAM 7.5 MG PO TABS: 7.5000 mg | ORAL_TABLET | Freq: Every day | ORAL | 0 refills | Status: AC

## 2020-06-10 MED ORDER — TRAMADOL HCL 50 MG PO TABS: 50.0000 mg | ORAL_TABLET | Freq: Three times a day (TID) | ORAL | 0 refills | Status: DC | PRN

## 2020-06-10 MED ORDER — TRAMADOL HCL 50 MG PO TABS
50.0000 mg | ORAL_TABLET | Freq: Four times a day (QID) | ORAL | Status: DC | PRN
  Administered 2020-06-10: 100 mg via ORAL
  Filled 2020-06-10: qty 2

## 2020-06-10 NOTE — Progress Notes (Signed)
Brief Nutrition Education Note   RD consulted for nutrition education regarding diabetes.   Lab Results  Component Value Date   HGBA1C 6.8 (H) 06/09/2020   PTA DM medications are none.   Labs reviewed.   Case discussed with RN, who is unsure if pt is aware of diabetes diagnosis (pt received "Living Well with Diabetes" book). Pt will likely discharge home today (was supposed to go home yesterday, however, stayed overnight for pain control.   Attempted to speak with pt x 3; pt was working with therapies at times of attempted visits. Also called pt hospital room phone, however, no answer.    RD provided "Carbohydrate Counting for People with Diabetes" handout from the Academy of [employer] Dietetics; attached to AVS/ discharge instructions in preferred language (Spanish).   RD will also refer to outpatient diabetes education through [facility]'s Nutrition and Diabetes Education Services.   Current diet order is carb modified, patient is consuming approximately 100% of meals at this time. Labs and medications reviewed. No further nutrition interventions warranted at this time. RD contact information provided. If additional nutrition issues arise, please re-consult RD.  Levada Schilling, RD, LDN, CDCES Registered Dietitian II Certified Diabetes Care and Education Specialist Please refer to [facility] for RD and/or RD on-call/weekend/after hours pager

## 2020-06-10 NOTE — Progress Notes (Signed)
Physical Therapy Treatment Patient Details Name: Rodney Pittman MRN: [id number] DOB: [birth year] Today's Date: 06/10/2020    History of Present Illness Rodney Pittman is a 49 y.o. male.  He is a level 2 trauma.  He was working on a billboard standing on a ladder 20 feet up in the air when the wind blew and the patient fell approximately 20 feet to the ground striking his left knee on a hollow pipe that was sticking out.  Open injury to left knee.    PT Comments    PTA re-entered room to adjust bledsoe brace that was not fitting properly.  Pt reports after adjustment brace is fitting much better.  Focused later portion of session on stair negotiation.  Wife present and observed stair training. Continue to recommend return home with HHPT.      Follow Up Recommendations  Supervision - Intermittent;Home health PT;Supervision for mobility/OOB     Equipment Recommendations  Rolling walker with 5" wheels;3in1 (PT)    Recommendations for Other Services       Precautions / Restrictions Precautions Precautions: Fall Restrictions Weight Bearing Restrictions: No    Mobility  Bed Mobility               General bed mobility comments: seated in recliner on arrival.  Transfers Overall transfer level: Needs assistance Equipment used: Rolling walker (2 wheeled) Transfers: Sit to/from Stand Sit to Stand: Min assist         General transfer comment: cues for hand placement to and from seated surface with LLE supported during transition.  Ambulation/Gait Ambulation/Gait assistance: Min guard Gait Distance (Feet): 15 Feet Assistive device: Rolling walker (2 wheeled) Gait Pattern/deviations: Step-to pattern;Decreased stride length;Decreased step length - right;Decreased step length - left;Antalgic Gait velocity: decreased   General Gait Details: Short trial to stairs and back. Refused further gt distance due to pain and fatigue.   Stairs Stairs: Yes Stairs assistance:  Min assist Stair Management: One rail Right;Forwards;Backwards Number of Stairs: 3 General stair comments: Cues for sequencing and hand placement on rails.  Holding R rail this session, Forward to ascend and backwards to descending.   Wheelchair Mobility    Modified Rankin (Stroke Patients Only)       Balance Overall balance assessment: Needs assistance Sitting-balance support: Feet supported Sitting balance-Leahy Scale: Good     Standing balance support: Bilateral upper extremity supported;During functional activity Standing balance-Leahy Scale: Fair Standing balance comment: reliant on RW due to pain in left LE                            Cognition Arousal/Alertness: Awake/alert Behavior During Therapy: WFL for tasks assessed/performed Overall Cognitive Status: Within Functional Limits for tasks assessed                                        Exercises      General Comments        Pertinent Vitals/Pain Pain Assessment: Faces Faces Pain Scale: Hurts even more Pain Location: Left knee Pain Descriptors / Indicators: Discomfort;Grimacing;Guarding;Sore Pain Intervention(s): Monitored during session;Repositioned    Home Living                      Prior Function            PT Goals (current goals can now be found  in the care plan section) Acute Rehab PT Goals Potential to Achieve Goals: Good Progress towards PT goals: Progressing toward goals    Frequency    Min 3X/week      PT Plan Current plan remains appropriate    Co-evaluation              AM-PAC PT "6 Clicks" Mobility   Outcome Measure  Help needed turning from your back to your side while in a flat bed without using bedrails?: A Little Help needed moving from lying on your back to sitting on the side of a flat bed without using bedrails?: A Little Help needed moving to and from a bed to a chair (including a wheelchair)?: A Little Help needed standing  up from a chair using your arms (e.g., wheelchair or bedside chair)?: A Little Help needed to walk in hospital room?: A Little Help needed climbing 3-5 steps with a railing? : A Little 6 Click Score: 18    End of Session Equipment Utilized During Treatment: Gait belt;Other (comment) Activity Tolerance: Patient tolerated treatment well;Patient limited by pain Patient left: in bed;with call bell/phone within reach;with family/visitor present;with bed alarm set Nurse Communication: Mobility status PT Visit Diagnosis: Muscle weakness (generalized) (M62.81);Difficulty in walking, not elsewhere classified (R26.2);Pain Pain - Right/Left: Left Pain - part of body: Leg     Time: 8101-7510 PT Time Calculation (min) (ACUTE ONLY): 35 min  Charges:  $Gait Training: 8-22 mins $Therapeutic Activity: 8-22 mins                     Bonney Leitz , PTA Acute Rehabilitation Services Pager [phone] Office [phone]     Christopher Glasscock Artis Delay 06/10/2020, 2:46 PM

## 2020-06-10 NOTE — Progress Notes (Signed)
Physical Therapy Treatment Patient Details Name: Rodney Pittman MRN: [id number] DOB: [birth year] Today's Date: 06/10/2020    History of Present Illness Rodney Pittman is a 49 y.o. male.  He is a level 2 trauma.  He was working on a billboard standing on a ladder 20 feet up in the air when the wind blew and the patient fell approximately 20 feet to the ground striking his left knee on a hollow pipe that was sticking out.  Open injury to left knee.  Translation via ipad interpreters Rodney Pittman [phone] and Rodney Pittman # [phone] (AM session).  PT Comments    Pt supine on arrival, agreeable to therapy session with good participation and fair tolerance for session. Pt limited due to increased LLE pain and reporting Bledsoe brace needs to be adjusted. Brace adjusted for fit and pt performed short gait trial in room using RW, but continues to report severe LLE pain and needing more pain meds/increased rest time prior to stair trial. Significant time spent instructing pt on proper technique for bed mobility, cryotherapy for pain reduction, LLE positioning/edema reduction. Pt continues to benefit from PT services to progress toward functional mobility goals. D/C recs below remain appropriate at this time.   Follow Up Recommendations  Supervision - Intermittent;Home health PT;Supervision for mobility/OOB     Equipment Recommendations  Rolling walker with 5" wheels;3in1 (PT)    Recommendations for Other Services       Precautions / Restrictions Precautions Precautions: Fall Required Braces or Orthoses: Other Brace Other Brace: bledsoe brace locked in extension, adjusted for fit x2 prior to mobility; air cast to R ankle, to be donned during mobility only Restrictions Weight Bearing Restrictions: No Other Position/Activity Restrictions: WBAT L LE    Mobility  Bed Mobility Overal bed mobility: Needs Assistance Bed Mobility: Supine to Sit;Sit to Supine     Supine to sit: Min assist Sit to  supine: Min assist   General bed mobility comments: increased time to perform, use of bed features, minA to LLE only, pt able to move RLE and trunk without assist  Transfers Overall transfer level: Needs assistance Equipment used: Rolling walker (2 wheeled) Transfers: Sit to/from Stand Sit to Stand: Min assist         General transfer comment: cues for hand placement to and from seated surface with LLE supported during transition. From EOB/chair heights  Ambulation/Gait Ambulation/Gait assistance: Min guard Gait Distance (Feet): 10 Feet (ft to chair away from bed) Assistive device: Rolling walker (2 wheeled) Gait Pattern/deviations: Step-to pattern;Decreased stride length;Decreased step length - right;Decreased step length - left;Antalgic Gait velocity: decreased   General Gait Details: Short trial to stairs and back. Refused further gt distance due to pain and fatigue.   Stairs Stairs: Yes Stairs assistance: Min assist Stair Management: One rail Right;Forwards;Backwards Number of Stairs: 3 General stair comments: Cues for sequencing and hand placement on rails.  Holding R rail this session, Forward to ascend and backwards to descending.   Wheelchair Mobility    Modified Rankin (Stroke Patients Only)       Balance Overall balance assessment: Needs assistance Sitting-balance support: Feet supported Sitting balance-Leahy Scale: Good     Standing balance support: Bilateral upper extremity supported;During functional activity Standing balance-Leahy Scale: Fair Standing balance comment: reliant on RW due to pain in left LE                            Cognition Arousal/Alertness: Awake/alert Behavior  During Therapy: WFL for tasks assessed/performed Overall Cognitive Status: Within Functional Limits for tasks assessed                                        Exercises      General Comments        Pertinent Vitals/Pain Pain Assessment:  0-10 Pain Score: 5  Faces Pain Scale: Hurts even more Pain Location: Left knee, increased with mobility Pain Descriptors / Indicators: Discomfort;Grimacing;Guarding;Sore Pain Intervention(s): Limited activity within patient's tolerance;Monitored during session;Repositioned;Premedicated before session;Ice applied    Home Living                      Prior Function            PT Goals (current goals can now be found in the care plan section) Acute Rehab PT Goals Patient Stated Goal: to return home PT Goal Formulation: With patient/family Time For Goal Achievement: 06/15/20 Potential to Achieve Goals: Good Progress towards PT goals: Progressing toward goals    Frequency    Min 3X/week      PT Plan Current plan remains appropriate    Co-evaluation              AM-PAC PT "6 Clicks" Mobility   Outcome Measure  Help needed turning from your back to your side while in a flat bed without using bedrails?: None Help needed moving from lying on your back to sitting on the side of a flat bed without using bedrails?: A Little Help needed moving to and from a bed to a chair (including a wheelchair)?: A Little Help needed standing up from a chair using your arms (e.g., wheelchair or bedside chair)?: A Little Help needed to walk in hospital room?: A Little Help needed climbing 3-5 steps with a railing? : A Little 6 Click Score: 19    End of Session Equipment Utilized During Treatment: Gait belt;Other (comment) Activity Tolerance: Patient tolerated treatment well;Patient limited by pain Patient left: with call bell/phone within reach;with family/visitor present;with bed alarm set;in chair Nurse Communication: Mobility status PT Visit Diagnosis: Muscle weakness (generalized) (M62.81);Difficulty in walking, not elsewhere classified (R26.2);Pain Pain - Right/Left: Left Pain - part of body: Leg     Time: 1111-1208 PT Time Calculation (min) (ACUTE ONLY): 57  min  Charges:  $Gait Training: 8-22 mins $Therapeutic Exercise: 8-22 mins $Therapeutic Activity: 23-37 mins                     Dawna Jakes P., PTA Acute Rehabilitation Services Pager: [phone] Office: [phone]   Rodney Pittman Rodney Pittman 06/10/2020, 3:00 PM

## 2020-06-10 NOTE — Discharge Instructions (Signed)
[facility] Discharge Instructions   General Discharge Instructions  Orthopaedic Injuries:  Open left patella fracture treated with irrigation debridement and open reduction internal fixation using screws              Avulsion fracture to right distal fibula (ankle).  Nonoperative treatment  WEIGHT BEARING STATUS: Weight-bear as tolerated left leg with brace on and knee in full extension (fully straight) Weight-bear as tolerated right ankle with Aircast on if so desired.  Do not have to wear a cast.  It is for your comfort  RANGE OF MOTION/ACTIVITY: No knee range of motion at this time.  Keep knee straight at all times.  unrestricted range of motion of right ankle  Bone health: Vitamin D levels are low normal.  Recommend supplementation to help with healing  Wound Care: Daily wound care starting on 06/11/2020.  Please see below.  Other: It appears that you do have diabetes based on lab findings during your hospitalization.  You need to establish care with a primary care physician to monitor this and address this.  It is important to get your diabetes under control to enable your fracture the best opportunity to heal without problems  Discharge Wound Care Instructions  Do NOT apply any ointments, solutions or lotions to pin sites or surgical wounds.  These prevent needed drainage and even though solutions like hydrogen peroxide kill bacteria, they also damage cells lining the pin sites that help fight infection.  Applying lotions or ointments can keep the wounds moist and can cause them to breakdown and open up as well. This can increase the risk for infection. When in doubt call the office.  Surgical incisions should be dressed daily.  If any drainage is noted, use one layer of adaptic, then gauze, Kerlix, and an ace wrap.  Once the incision is completely dry and without drainage, it may be left open to air out.  Showering may begin 36-48 hours later.  Cleaning gently  with soap and water.  Traumatic wounds should be dressed daily as well.    One layer of adaptic, gauze, Kerlix, then ace wrap.  The adaptic can be discontinued once the draining has ceased    If you have a wet to dry dressing: wet the gauze with saline the squeeze as much saline out so the gauze is moist (not soaking wet), place moistened gauze over wound, then place a dry gauze over the moist one, followed by Kerlix wrap, then ace wrap.   DVT/PE prophylaxis: Lovenox injection daily x 14 days  Diet: as you were eating previously.  Can use over the counter stool softeners and bowel preparations, such as Miralax, to help with bowel movements.  Narcotics can be constipating.  Be sure to drink plenty of fluids  PAIN MEDICATION USE AND EXPECTATIONS  You have likely been given narcotic medications to help control your pain.  After a traumatic event that results in an fracture (broken bone) with or without surgery, it is ok to use narcotic pain medications to help control one's pain.  We understand that everyone responds to pain differently and each individual patient will be evaluated on a regular basis for the continued need for narcotic medications. Ideally, narcotic medication use should last no more than 6-8 weeks (coinciding with fracture healing).   As a patient it is your responsibility as well to monitor narcotic medication use and report the amount and frequency you use these medications when you come to your office visit.  We would also advise that if you are using narcotic medications, you should take a dose prior to therapy to maximize you participation.  IF YOU ARE ON NARCOTIC MEDICATIONS IT IS NOT PERMISSIBLE TO OPERATE A MOTOR VEHICLE (MOTORCYCLE/CAR/TRUCK/MOPED) OR HEAVY MACHINERY DO NOT MIX NARCOTICS WITH OTHER CNS (CENTRAL NERVOUS SYSTEM) DEPRESSANTS SUCH AS ALCOHOL   STOP SMOKING OR USING NICOTINE PRODUCTS!!!!  As discussed nicotine severely impairs your body's ability to heal  surgical and traumatic wounds but also impairs bone healing.  Wounds and bone heal by forming microscopic blood vessels (angiogenesis) and nicotine is a vasoconstrictor (essentially, shrinks blood vessels).  Therefore, if vasoconstriction occurs to these microscopic blood vessels they essentially disappear and are unable to deliver necessary nutrients to the healing tissue.  This is one modifiable factor that you can do to dramatically increase your chances of healing your injury.    (This means no smoking, no nicotine gum, patches, etc)  DO NOT USE NONSTEROIDAL ANTI-INFLAMMATORY DRUGS (NSAID'S)  Using products such as Advil (ibuprofen), Aleve (naproxen), Motrin (ibuprofen) for additional pain control during fracture healing can delay and/or prevent the healing response.  If you would like to take over the counter (OTC) medication, Tylenol (acetaminophen) is ok.  However, some narcotic medications that are given for pain control contain acetaminophen as well. Therefore, you should not exceed more than 4000 mg of tylenol in a day if you do not have liver disease.  Also note that there are may OTC medicines, such as cold medicines and allergy medicines that my contain tylenol as well.  If you have any questions about medications and/or interactions please ask your doctor/PA or your pharmacist.      ICE AND ELEVATE INJURED/OPERATIVE EXTREMITY  Using ice and elevating the injured extremity above your heart can help with swelling and pain control.  Icing in a pulsatile fashion, such as 20 minutes on and 20 minutes off, can be followed.    Do not place ice directly on skin. Make sure there is a barrier between to skin and the ice pack.    Using frozen items such as frozen peas works well as the conform nicely to the are that needs to be iced.  USE AN ACE WRAP OR TED HOSE FOR SWELLING CONTROL  In addition to icing and elevation, Ace wraps or TED hose are used to help limit and resolve swelling.  It is  recommended to use Ace wraps or TED hose until you are informed to stop.    When using Ace Wraps start the wrapping distally (farthest away from the body) and wrap proximally (closer to the body)   Example: If you had surgery on your leg or thing and you do not have a splint on, start the ace wrap at the toes and work your way up to the thigh        If you had surgery on your upper extremity and do not have a splint on, start the ace wrap at your fingers and work your way up to the upper arm  IF YOU ARE IN A SPLINT OR CAST DO NOT REMOVE IT FOR ANY REASON   If your splint gets wet for any reason please contact the office immediately. You may shower in your splint or cast as long as you keep it dry.  This can be done by wrapping in a cast cover or garbage back (or similar)  Do Not stick any thing down your splint or cast such as pencils, money,  or hangers to try and scratch yourself with.  If you feel itchy take benadryl as prescribed on the bottle for itching  IF YOU ARE IN A CAM BOOT (BLACK BOOT)  You may remove boot periodically. Perform daily dressing changes as noted below.  Wash the liner of the boot regularly and wear a sock when wearing the boot. It is recommended that you sleep in the boot until told otherwise    Call office for the following:  Temperature greater than 101F  Persistent nausea and vomiting  Severe uncontrolled pain  Redness, tenderness, or signs of infection (pain, swelling, redness, odor or green/yellow discharge around the site)  Difficulty breathing, headache or visual disturbances  Hives  Persistent dizziness or light-headedness  Extreme fatigue  Any other questions or concerns you may have after discharge  In an emergency, call 911 or go to an Emergency Department at a nearby hospital  HELPFUL INFORMATION  ? If you had a block, it will wear off between 8-24 hrs postop typically.  This is period when your pain may go from nearly zero to the pain you  would have had postop without the block.  This is an abrupt transition but nothing dangerous is happening.  You may take an extra dose of narcotic when this happens.  ? You should wean off your narcotic medicines as soon as you are able.  Most patients will be off or using minimal narcotics before their first postop appointment.   ? We suggest you use the pain medication the first night prior to going to bed, in order to ease any pain when the anesthesia wears off. You should avoid taking pain medications on an empty stomach as it will make you nauseous.  ? Do not drink alcoholic beverages or take illicit drugs when taking pain medications.  ? In most states it is against the law to drive while you are in a splint or sling.  And certainly against the law to drive while taking narcotics.  ? You may return to work/school in the next couple of days when you feel up to it.   ? Pain medication may make you constipated.  Below are a few solutions to try in this order: - Decrease the amount of pain medication if you aren't having pain. - Drink lots of decaffeinated fluids. - Drink prune juice and/or each dried prunes  o If the first 3 don't work start with additional solutions - Take Colace - an over-the-counter stool softener - Take Senokot - an over-the-counter laxative - Take Miralax - a stronger over-the-counter laxative     CALL THE OFFICE WITH ANY QUESTIONS OR CONCERNS: [phone]   VISIT OUR WEBSITE FOR ADDITIONAL INFORMATION: [email or web address]      Contar carbohidratos y la diabetes  Por qu es importante el conteo de carbohidratos?  [employer] las porciones de carbohidratos ayuda a Sales executive nivel de glucosa (azcar) en su sangre para que se sienta mejor.  . El equilibrio entre los carbohidratos que come y Dietitian determina el nivel de glucosa que tendr en la sangre despus de comer.  [employer] carbohidratos tambin le ayudar a planificar sus comidas.   Qu alimentos  contienen carbohidratos?  Entre los alimentos con carbohidratos se incluyen:  . Panes, galletas saladas y cereales  . Pastas, arroz y granos  . Vegetales (verduras) con almidn, como papas, elote (maz o choclo) y chcharos (guisantes o arvejas)  . Frijoles (habichuelas) y legumbres  . Leche, leche de soya  y Dentistyogur  . Nils PyleFrutas y jugos de fruta  . Dulces como pasteles, galletas, helados, mermeladas y jaleas   Porciones de carbohidratos  Al planificar comidas para la diabetes, recuerde que un alimento con 1 porcin de carbohidratos contiene aproximadamente 15 gramos de carbohidratos:  . Revise el tamao de las porciones con tazas y cucharas de medir o con una pesa de alimentos.  Clint Lipps. Lea los Datos de Nutricin en las etiquetas de los alimentos para saber cuntos gramos de carbohidratos contienen los alimentos que come.   Los Eaton Corporationalimentos en las listas de este folleto muestran porciones que contienen cerca de 15 gramos de carbohidratos. Copyright Academy of Nutrition and Dietetics. This handout may be duplicated for client education. Carbohydrate Counting for Diabetes (Spanish) - 2   Consejos para planificar sus comidas  . Un Plan de Alimentacin indica cuntas porciones de carbohidratos consumir en sus comidas y refrigerios (snacks). Para muchos adultos es adecuado comer 3 a 5 porciones de carbohidratos en cada comida y de 1 a 2 porciones de carbohidratos, en cada refrigerio.  . En un Plan de Alimentacin diaria saludable, la mayora de los carbohidratos provienen de:  o Al menos 6 porciones de frutas y vegetales sin almidn  o Al menos 6 porciones de Forensic scientistgranos, frijoles y Sports administratorvegetales con almidn, con al menos 3 de estas porciones de granos integrales (enteros)  o Al menos 2 porciones de Teaching laboratory technicianleche o productos lcteos  . Revise regularmente su nivel de glucosa en la sangre. Esto puede indicarle si necesita ajustar las horas a las que consume carbohidratos.  . Comer alimentos que contienen Preemptionfibra, como granos  Friedensintegrales, y comer muy pocos alimentos salados es bueno para su salud.  . Coma 4 a 6 onzas de carne u otros alimentos con protenas (como hamburguesas de soya) cada da. Elija fuentes de protena bajas en grasa, como carne de res y de cerdo bajas en grasa, pollo, pescado, queso bajo en grasa o alimentos vegetarianos como la soya.  . Coma algunas grasas saludables, como aceite de [place], de canola y nueces.  . Coma muy pocas grasas saturadas. Estas grasas no son saludables y se Merchandiser, retailencuentran en la mantequilla, la crema y las carnes con mucha grasa, como el tocino (tocineta) y las salchichas o Advertising copywriterchorizos.  . Coma muy pocas o nada de grasas trans. Estas grasas no son saludables y se encuentran en todos los alimentos que contienen aceites "parcialmente hidrogenados" en su lista de ingredientes.   Consejos para leer etiquetas  En los Datos de Nutricin de las etiquetas aparece una lista con el total de gramos de carbohidratos en una porcin estndar. La porcin estndar puede ser mayor o menor que 1 porcin de carbohidratos. Para saber cuntas porciones de carbohidratos hay en un alimento:  . Primero mire el tamao de la porcin estndar de la etiqueta.  Tia Alert. Luego verifique el total de gramos de carbohidratos. Esta es la cantidad de carbohidratos en 1 porcin estndar. Divida el total de gramos de carbohidratos por 15. Este nmero equivale al nmero de porciones de carbohidratos en 1 porcin estndar. Recuerde: 1 porcin de carbohidratos equivale a 15 gramos de carbohidratos.  . Nota: Puede ignorar los gramos de Morgan Stanleyazcar en los Datos de Nutricin, ya que estn incluidos en el total de gramos de carbohidratos.  Copyright Academy of Nutrition and Dietetics. This handout may be duplicated for client education. Carbohydrate Counting for Diabetes (Spanish) - 3   Listas de alimentos para el conteo de carbohidratos  1 porcin =  cerca de 15 gramos de carbohidratos  Almidones  . 1 rebanada de pan (1 onza)  . 1 tortilla (6  pulgadas)  .  rosca de pan (bagel) grande (1 onza)  . 2 tortillas para taco (5 pulgadas)  .  pan para hamburguesa o para salchicha (hot dog) (3/4 onza)  .  taza de cereal listo para comer sin endulzar  .  taza de cereal cocido  . 1 taza de sopa a base de caldo  . 4-6 galletitas saladas  . ? taza de pasta o arroz (cocidos)  .  taza de frijoles, chcharos, granos de elote, camotes (batatas, boniatos), calabaza (zapallo), pur de papas o papas hervidas (cocidos)  .  papa grande asada (3 onzas)  .  onza de pretzels, papitas o totopos (tortilla chips)  . 3 tazas de palomitas de maz Best boy) (ya preparadas)   Nils Pyle  . 1 fruta fresca pequea ( a 1 taza)  .  taza de fruta enlatada o congelada  . 17 uvas pequeas (3 onzas)  . 1 taza de meln, bayas (moras)  .  vaso de jugo de fruta  . 2 cucharadas de frutas secas (arndanos azules/blueberries, cerezas, arndanos rojos/cranberries, frutas surtidas, uvas pasas/pasitas)  Leche  . 1 taza de [employer] o reducida en grasa  . 1 taza de leche de soya  . ? taza de yogur descremado endulzado con un edulcorante sin azcar (6 onzas)   Dulces y postres  . pastel cuadrado de 2 pulgadas (sin betn/cobertura)  . 2 galletitas dulces (? onzas)  .  taza de helado o yogur congelado  .  taza de sorbete (sherbet) o nieve (sorbet)  . 1 cucharada de jarabe (sirope), mermelada, jalea, azcar o miel  . 2 cucharadas de jarabe bajo en caloras  Copyright Academy of Nutrition and Dietetics. This handout may be duplicated for client education. Carbohydrate Counting for Diabetes (Spanish) - 4   Otros alimentos  . Cuente 1 taza de vegetales crudos o  taza de vegetales sin almidn, cocidas, como porciones de alimentos con cero (0) carbohidratos o "sin restriccin." Si come 3 o ms porciones en una comida, cuntelas como 1 porcin de carbohidratos.  . Los alimentos que contienen menos de 20 caloras en cada porcin tambin pueden contarse como  porciones con cero carbohidratos o alimentos "sin restriccin."  . Cuente 1 taza de guiso (estofado) u otros alimentos combinados como 2 porciones de carbohidratos.   Notas: Copyright Academy of Nutrition and Dietetics. This handout may be duplicated for client education. Carbohydrate Counting for Diabetes (Spanish) - 5   Contar carbohidratos y la diabetes: Ejemplo de men para 1 da Desay[place]  1 pltano/banana pequeo (1 carbohidrato)   taza de hojuelas de maz (cornflakes) (1 carbohidrato)  1 taza de leche descremada o baja en grasa (1 carbohidrato)  1 rebanada de pan de trigo integral (1 carbohidrato)  1 cucharadita de margarina   Almuerzo  2 onzas de rebanadas de [place]  2 rebanadas de pan de trigo integral (2 carbohidratos)  2 hojas de Company secretary  4 palitos de apio  4 palitos de zanahoria  1 Environmental health practitioner (1 carbohidrato)  1 taza de leche descremada o baja en grasa (1 carbohidrato)   Refrigerio  2 cucharadas de uvas pasas/pasitas (1 carbohidrato)   onzas de mini pretzels sin sal (1 carbohidrato)   Cena  3 onzas de carne asada de res, magra   papa grande asada (2 carbohidratos)  1 cucharada de crema agria reducida en grasa  taza de ejotes/habichuelas verdes/chauchas  1 taza de ensalada de vegetales  1 cucharada de aderezo para ensaladas reducido en caloras  1 panecillo de trigo integral (1 carbohidrato)  1 cucharadita de margarina  1 taza de bolitas de meln (1 carbohidrato)   Refrigerio  6 onzas de yogur de frutas bajo en grasa, sin azcar (1 carbohidrato)  2 cucharadas de nueces sin sal     Contar carbohidratos y la diabetes  Por qu es importante el conteo de carbohidratos?  [employer] las porciones de carbohidratos ayuda a Sales executive nivel de glucosa (azcar) en su sangre para que se sienta mejor.  . El equilibrio entre los carbohidratos que come y Dietitian determina el nivel de glucosa que tendr en la sangre despus de comer.  [employer] carbohidratos tambin le  ayudar a planificar sus comidas.   Qu alimentos contienen carbohidratos?  Entre los alimentos con carbohidratos se incluyen:  . Panes, galletas saladas y cereales  . Pastas, arroz y granos  . Vegetales (verduras) con almidn, como papas, elote (maz o choclo) y chcharos (guisantes o arvejas)  . Frijoles (habichuelas) y legumbres  . Leche, leche de soya y Dentist  . Nils Pyle y jugos de fruta  . Dulces como pasteles, galletas, helados, mermeladas y jaleas   Porciones de carbohidratos  Al planificar comidas para la diabetes, recuerde que un alimento con 1 porcin de carbohidratos contiene aproximadamente 15 gramos de carbohidratos:  . Revise el tamao de las porciones con tazas y cucharas de medir o con una pesa de alimentos.  Clint Lipps los Datos de Nutricin en las etiquetas de los alimentos para saber cuntos gramos de carbohidratos contienen los alimentos que come.   [employer] de este folleto muestran porciones que contienen cerca de 15 gramos de carbohidratos. Copyright Academy of Nutrition and Dietetics. This handout may be duplicated for client education. Carbohydrate Counting for Diabetes (Spanish) - 2   Consejos para planificar sus comidas  . Un Plan de Alimentacin indica cuntas porciones de carbohidratos consumir en sus comidas y refrigerios (snacks). Para muchos adultos es adecuado comer 3 a 5 porciones de carbohidratos en cada comida y de 1 a 2 porciones de carbohidratos, en cada refrigerio.  . En un Plan de Alimentacin diaria saludable, la mayora de los carbohidratos provienen de:  o Al menos 6 porciones de frutas y vegetales sin almidn  o Al menos 6 porciones de Forensic scientist, frijoles y Sports administrator con almidn, con al menos 3 de estas porciones de granos integrales (enteros)  o Al menos 2 porciones de Teaching laboratory technician o productos lcteos  . Revise regularmente su nivel de glucosa en la sangre. Esto puede indicarle si necesita ajustar las horas a las que consume carbohidratos.  .  Comer alimentos que contienen [place], como granos [place], y comer muy pocos alimentos salados es bueno para su salud.  . Coma 4 a 6 onzas de carne u otros alimentos con protenas (como hamburguesas de soya) cada da. Elija fuentes de protena bajas en grasa, como carne de res y de cerdo bajas en grasa, pollo, pescado, queso bajo en grasa o alimentos vegetarianos como la soya.  . Coma algunas grasas saludables, como aceite de [place], de canola y nueces.  . Coma muy pocas grasas saturadas. Estas grasas no son saludables y se Merchandiser, retail, la crema y las carnes con mucha grasa, como el tocino (tocineta) y las salchichas o Advertising copywriter.  . Coma muy pocas o nada de grasas trans.  Estas grasas no son saludables y se encuentran en todos los alimentos que contienen aceites "parcialmente hidrogenados" en su lista de ingredientes.   Consejos para leer etiquetas  En los Datos de Nutricin de las etiquetas aparece una lista con el total de gramos de carbohidratos en una porcin estndar. La porcin estndar puede ser mayor o menor que 1 porcin de carbohidratos. Para saber cuntas porciones de carbohidratos hay en un alimento:  . Primero mire el tamao de la porcin estndar de la etiqueta.  Tia Alert verifique el total de gramos de carbohidratos. Esta es la cantidad de carbohidratos en 1 porcin estndar. Divida el total de gramos de carbohidratos por 15. Este nmero equivale al nmero de porciones de carbohidratos en 1 porcin estndar. Recuerde: 1 porcin de carbohidratos equivale a 15 gramos de carbohidratos.  . Nota: Puede ignorar los gramos de [employer] Datos de Nutricin, ya que estn incluidos en el total de gramos de carbohidratos.  Copyright Academy of Nutrition and Dietetics. This handout may be duplicated for client education. Carbohydrate Counting for Diabetes (Spanish) - 3   Listas de alimentos para el conteo de carbohidratos  1 porcin = cerca de 15 gramos de carbohidratos  Almidones   . 1 rebanada de pan (1 onza)  . 1 tortilla (6 pulgadas)  .  rosca de pan (bagel) grande (1 onza)  . 2 tortillas para taco (5 pulgadas)  .  pan para hamburguesa o para salchicha (hot dog) (3/4 onza)  .  taza de cereal listo para comer sin endulzar  .  taza de cereal cocido  . 1 taza de sopa a base de caldo  . 4-6 galletitas saladas  . ? taza de pasta o arroz (cocidos)  .  taza de frijoles, chcharos, granos de elote, camotes (batatas, boniatos), calabaza (zapallo), pur de papas o papas hervidas (cocidos)  .  papa grande asada (3 onzas)  .  onza de pretzels, papitas o totopos (tortilla chips)  . 3 tazas de palomitas de maz Best boy) (ya preparadas)   Nils Pyle  . 1 fruta fresca pequea ( a 1 taza)  .  taza de fruta enlatada o congelada  . 17 uvas pequeas (3 onzas)  . 1 taza de meln, bayas (moras)  .  vaso de jugo de fruta  . 2 cucharadas de frutas secas (arndanos azules/blueberries, cerezas, arndanos rojos/cranberries, frutas surtidas, uvas pasas/pasitas)  Leche  . 1 taza de [employer] o reducida en grasa  . 1 taza de leche de soya  . ? taza de yogur descremado endulzado con un edulcorante sin azcar (6 onzas)   Dulces y postres  . pastel cuadrado de 2 pulgadas (sin betn/cobertura)  . 2 galletitas dulces (? onzas)  .  taza de helado o yogur congelado  .  taza de sorbete (sherbet) o nieve (sorbet)  . 1 cucharada de jarabe (sirope), mermelada, jalea, azcar o miel  . 2 cucharadas de jarabe bajo en caloras  Copyright Academy of Nutrition and Dietetics. This handout may be duplicated for client education. Carbohydrate Counting for Diabetes (Spanish) - 4   Otros alimentos  . Cuente 1 taza de vegetales crudos o  taza de vegetales sin almidn, cocidas, como porciones de alimentos con cero (0) carbohidratos o "sin restriccin." Si come 3 o ms porciones en una comida, cuntelas como 1 porcin de carbohidratos.  . Los alimentos que contienen menos de 20 caloras  en cada porcin tambin pueden contarse como porciones con cero carbohidratos o alimentos "sin  restriccin."  . Cuente 1 taza de guiso (estofado) u otros alimentos combinados como 2 porciones de carbohidratos.   Notas: Copyright Academy of Nutrition and Dietetics. This handout may be duplicated for client education. Carbohydrate Counting for Diabetes (Spanish) - 5   Contar carbohidratos y la diabetes: Ejemplo de men para 1 da [place]  1 pltano/banana pequeo (1 carbohidrato)   taza de hojuelas de maz (cornflakes) (1 carbohidrato)  1 taza de leche descremada o baja en grasa (1 carbohidrato)  1 rebanada de pan de trigo integral (1 carbohidrato)  1 cucharadita de margarina   Almuerzo  2 onzas de rebanadas de [place]  2 rebanadas de pan de trigo integral (2 carbohidratos)  2 hojas de Company secretary  4 palitos de apio  4 palitos de zanahoria  1 manzana mediana (1 carbohidrato)  1 taza de leche descremada o baja en grasa (1 carbohidrato)   Refrigerio  2 cucharadas de uvas pasas/pasitas (1 carbohidrato)   onzas de mini pretzels sin sal (1 carbohidrato)   Cena  3 onzas de carne asada de res, magra   papa grande asada (2 carbohidratos)  1 cucharada de crema agria reducida en grasa   taza de ejotes/habichuelas verdes/chauchas  1 taza de ensalada de vegetales  1 cucharada de aderezo para ensaladas reducido en caloras  1 panecillo de trigo integral (1 carbohidrato)  1 cucharadita de margarina  1 taza de bolitas de meln (1 carbohidrato)   Refrigerio  6 onzas de yogur de frutas bajo en grasa, sin azcar (1 carbohidrato)  2 cucharadas de nueces sin sal

## 2020-06-11 ENCOUNTER — Encounter (HOSPITAL_COMMUNITY): Payer: Self-pay | Admitting: Obstetrics and Gynecology

## 2020-06-11 ENCOUNTER — Encounter (HOSPITAL_COMMUNITY): Payer: Self-pay | Admitting: Orthopedic Surgery

## 2020-06-17 ENCOUNTER — Encounter (INDEPENDENT_AMBULATORY_CARE_PROVIDER_SITE_OTHER): Payer: Self-pay

## 2020-06-18 ENCOUNTER — Other Ambulatory Visit: Payer: Self-pay

## 2020-06-18 ENCOUNTER — Other Ambulatory Visit (INDEPENDENT_AMBULATORY_CARE_PROVIDER_SITE_OTHER): Payer: Self-pay | Admitting: Primary Care

## 2020-06-18 ENCOUNTER — Encounter (INDEPENDENT_AMBULATORY_CARE_PROVIDER_SITE_OTHER): Payer: Self-pay | Admitting: Primary Care

## 2020-06-18 ENCOUNTER — Ambulatory Visit (INDEPENDENT_AMBULATORY_CARE_PROVIDER_SITE_OTHER): Payer: No Typology Code available for payment source | Admitting: Primary Care

## 2020-06-18 VITALS — BP 132/89 | HR 90 | Temp 97.5°F

## 2020-06-18 DIAGNOSIS — Z7689 Persons encountering health services in other specified circumstances: Secondary | ICD-10-CM | POA: Diagnosis not present

## 2020-06-18 DIAGNOSIS — W1789XA Other fall from one level to another, initial encounter: Secondary | ICD-10-CM

## 2020-06-18 DIAGNOSIS — Z09 Encounter for follow-up examination after completed treatment for conditions other than malignant neoplasm: Secondary | ICD-10-CM | POA: Diagnosis not present

## 2020-06-18 DIAGNOSIS — E119 Type 2 diabetes mellitus without complications: Secondary | ICD-10-CM | POA: Diagnosis not present

## 2020-06-18 MED ORDER — METHOCARBAMOL 500 MG PO TABS: 500.0000 mg | ORAL_TABLET | Freq: Four times a day (QID) | ORAL | 1 refills | Status: DC | PRN

## 2020-06-18 MED ORDER — DOCUSATE SODIUM 100 MG PO CAPS: 100.0000 mg | ORAL_CAPSULE | Freq: Two times a day (BID) | ORAL | 1 refills | Status: AC

## 2020-06-18 MED ORDER — METFORMIN HCL ER 500 MG PO TB24: 500.0000 mg | ORAL_TABLET | Freq: Every day | ORAL | 0 refills | Status: DC

## 2020-06-18 MED FILL — metFORMIN HCL ER 500 MG TB2: 500 | 30 days supply | Qty: 30 | Fill #0

## 2020-06-18 MED FILL — METHOCARBAMOL 500 MG TABS: 500 | 15 days supply | Qty: 120 | Fill #0

## 2020-06-18 NOTE — Progress Notes (Signed)
Assessment and Plan: Rodney Pittman was seen today for hospitalization follow-up.  Diagnoses and all orders for this visit:  Encounter to establish care Rodney Passe, NP-C will be your  (PCP) she is mastered prepared . She is skilled to diagnosed and treat illness. Also able to answer health concern as well as continuing care of varied medical conditions, not limited by cause, organ system, or diagnosis.   Hospital discharge follow-up Retrieve from hospital discharge Rodney Galas, MD. Schedule an appointment as soon as possible for a visit in 2 week(s). [place] [facility] Follow up on 06/18/2020.Home, [facility] Follow up. VTE prophylaxis: Lovenox 40mg  qd x 14 days, Weight-bear as tolerated left leg with knee in full extension and hinged brace on and locked out in full extension.    Fall from height of greater than 3 feet male who sustained a fall approximately 22 feet after he fell off of a ladder. Open left patella fracture treated with irrigation debridement and open reduction internal fixation using screws              Avulsion fracture to right distal fibula (ankle).  Nonoperative treatment  Type 2 diabetes mellitus without complication, without long-term current use of insulin (HCC)  Goal of therapy: Less than 6.5 hemoglobin A1c.  Reduce foods that are high in carbohydrates are the following rice, potatoes, breads, sugars, and pastas.  Reduction in the intake (eating) will assist in lowering your blood sugars.  Other orders -     metFORMIN (GLUCOPHAGE XR) 500 MG 24 hr tablet; Take 1 tablet (500 mg total) by mouth daily with breakfast. -     docusate sodium (COLACE) 100 MG capsule; Take 1 capsule (100 mg total) by mouth 2 (two) times daily. -     methocarbamol (ROBAXIN) 500 MG tablet; Take 1-2 tablets (500-1,000 mg total) by mouth every 6 (six) hours as needed for muscle spasms.   HPI Mr. 49 y.o.male presents for follow up from the hospital.  Admit date to the hospital was 06/07/20, patient was discharged from the hospital on 06/10/20, patient was admitted for: Open comminuted fracture of left patella  Past Medical History:  Diagnosis Date  . Avulsion fracture of lateral malleolus of right fibula 06/09/2020  . Diabetes (HCC) 06/09/2020  . Fall 06/07/2020   FALL FROM LADDER   . Open wound of knee, complicated 06/09/2020     No Known Allergies    Current Outpatient Medications on File Prior to Visit  Medication Sig Dispense Refill  . acetaminophen (TYLENOL) 500 MG tablet Take 1 tablet (500 mg total) by mouth every 12 (twelve) hours. 60 tablet 0  . ascorbic acid (VITAMIN C) 500 MG tablet Take 2 tablets (1,000 mg total) by mouth daily. 60 tablet 1  . Cholecalciferol (VITAMIN D) 125 MCG (5000 UT) CAPS Take 1 capsule by mouth daily. 30 capsule 6  . docusate sodium (COLACE) 100 MG capsule Take 1 capsule (100 mg total) by mouth 2 (two) times daily. 10 capsule 0  . enoxaparin (LOVENOX) 40 MG/0.4ML injection Inject 0.4 mLs (40 mg total) into the skin daily for 14 days. 5.6 mL 0  . methocarbamol (ROBAXIN) 500 MG tablet Take 1-2 tablets (500-1,000 mg total) by mouth every 6 (six) hours as needed for muscle spasms. 60 tablet 0  . oxyCODONE-acetaminophen (PERCOCET) 5-325 MG tablet Take 1-2 tablets by mouth every 6 (six) hours as needed for moderate pain or severe pain. 50 tablet 0  . polyethylene glycol (MIRALAX /  GLYCOLAX) 17 g packet Take 17 g by mouth daily. 14 each 0  . traMADol (ULTRAM) 50 MG tablet Take 1 tablet (50 mg total) by mouth every 8 (eight) hours as needed for moderate pain. 30 tablet 0  . Multiple Vitamin (MULTIVITAMIN WITH MINERALS) TABS tablet Take 1 tablet by mouth daily. (Patient not taking: Reported on 06/18/2020) 30 tablet 2   No current facility-administered medications on file prior to visit.    ROS: all negative except above.  Review of Systems  Constitutional: Positive for malaise/fatigue.  Cardiovascular: Positive  for leg swelling.  Musculoskeletal:       Left leg pain and right ankle  Neurological: Positive for weakness.  All other systems reviewed and are negative.  Physical Exam: There were no vitals filed for this visit. BP 132/89 (BP Location: Right Arm, Patient Position: Sitting, Cuff Size: Large)   Pulse 90   Temp (!) 97.5 F (36.4 C) (Temporal)  General Appearance: Well nourished, in no apparent distress. Sinuses: No Frontal/maxillary tenderness  Neck: Supple, thyroid normal.  Respiratory: Respiratory effort normal, BS equal bilaterally without rales, rhonchi, wheezing or stridor.  Cardio: RRR with no MRGs. Brisk peripheral pulses without edema.  Abdomen: Soft, + BS.  Non tender, no guarding, rebound, hernias, masses. Lymphatics: Non tender without lymphadenopathy.  Musculoskeletal:deferred  Skin: Warm, dry without rashes, lesions, ecchymosis.  Neuro: Cranial nerves intact. Normal muscle tone, no cerebellar symptoms. Sensation intact.  Psych:  Judgment appropriate.    [name], NP 9:52 AM

## 2020-06-23 NOTE — Op Note (Signed)
NAMEAMY, BELLOSO MEDICAL RECORD CN:[id number] ACCOUNT [id number] DATE OF BIRTH:August 24, 1971 FACILITY: [facility] LOCATION: [facility] PHYSICIAN:Colson Barco H. Aailyah Dunbar, MD  OPERATIVE REPORT  DATE OF PROCEDURE:  06/07/2020  PREOPERATIVE DIAGNOSIS:  Grade IIIA open left patellar fracture.  POSTOPERATIVE DIAGNOSES: 1.  Grade IIIA open left patellar fracture. 2.  Grade IIIA open medial femoral condyle fracture.  PROCEDURES: 1.  Open treatment of left medial femoral condyle fracture. 2.  Open reduction internal fixation of left patella fracture and patellar tendon repair. 3.  Irrigation and debridement of open fracture including bone. 4.  Application of small wound vac.  SURGEON:  [name], MD  ASSISTANT:  Montez Morita, PA-C.  ANESTHESIA:  General.  COMPLICATIONS:  None.  TOURNIQUET:  None.  DISPOSITION:  To PACU.  CONDITION:  Stable.  BRIEF SUMMARY OF INDICATIONS FOR PROCEDURE:  The patient is a 49 year old who fell over 22 feet from a sign that he was trying to install, landing directly on a metal post with his knee, resulting in shattering of his kneecap and an open injury.  I  discussed with the patient preoperatively the risks and benefits of surgical treatment, including the possibility of failure to obtain a reasonable congruity of his patella, the possibility of an excision of his patella, infection, nerve injury, vessel  injury, DVT, PE, loss of motion and multiple others.  After full discussion and acknowledgement of these risks, he did wish to proceed.  BRIEF SUMMARY OF PROCEDURE:  The patient was taken to the operating room where general anesthesia was induced.  A chlorhexidine wash and then Betadine scrub and paint was performed of his left lower extremity.  The patient's wound was long and stellate.   After timeout, the traumatic edges were extended proximally and distally.  This revealed not only a severely comminuted fracture of the patella, but also a medial  femoral condyle fracture.  I used curettage and lavage with 9000 mL of fluid to clean and  freshen up all bone edges.  I sharply excised and removed the devascularized bone segment.  We applied a fresh drape and attire and turned our attention to the medial femoral condyle.  Here, a direct open inspection and challenge of the fragments were  undertaken.  Some of these were unstable and prone to release within the knee joint as free body and these were removed.  Others were impacted into a stable position and then along the outer margins were excised as well.  This was then irrigated further.   After open treatment of the medial femoral condyle, attention was turned to the patella.  The patella consisted of medial and lateral piece proximally and then a comminuted distal piece.  Consequently, we were able to after excision of the multiple  small fragments of the distal pole take a #5 FiberWire and passed that through the patellar tendon in 2 limbs.  We then were able to gain a largely anatomic reduction of the large medial and lateral proximal pieces of the patella.  Once these were  secured with cannulated screw fixation, we then passed the sutures from the distal pole of the patella through the patella and tied it over the superior pole.  The knee was taken through a range of motion, demonstrating that it was intact.  Additional  PDS suture was used to repair the retinaculum.  All wounds were irrigated thoroughly and then closed in a standard layered fashion.  Because of the severity of the trauma and a long incision, also wound VAC  was applied, a well-padded dressing and then  knee immobilizer.  The patient was taken to PACU in stable condition.  Montez Morita, PA-C, was present and assisting throughout.  An assistant was necessary for retraction during the debridement, retraction during treatment of the open femoral condyle and  then also assistance with provisional definitive fixation for the patella and  patellar tendon.  PROGNOSIS:  The patient will be partial weightbearing with toe touch weightbearing in full extension, but will not begin motion until return to the office in 2 weeks.  This will need to be prone flexion with passive extension and then we will increase  this by 30 degrees at 2 week intervals in the ensuing months.  He has at very high risk for infection and arthritis given the magnitude of injury and the extent of articular destruction.  However, it is hopeful this has been mitigated by the excellent  reduction we were able to obtain with the proximal pole.  VN/NUANCE  D:06/23/2020 T:06/23/2020 JOB:[id number]

## 2020-07-01 ENCOUNTER — Telehealth: Payer: Self-pay

## 2020-07-01 ENCOUNTER — Other Ambulatory Visit: Payer: Self-pay

## 2020-07-01 ENCOUNTER — Ambulatory Visit: Payer: No Typology Code available for payment source | Attending: Primary Care

## 2020-07-01 NOTE — Telephone Encounter (Signed)
Patient came into clinic for an appointment with financial states they are waiting on a referral for an orthopedic specialist. Patient states he will also like to know who will be removing the stitches/staples on his leg. Patient does not know what or with who he needs to follow up.  Please advise [phone]

## 2020-07-02 NOTE — Telephone Encounter (Signed)
Interpretor Citla [id number] Patient DOB verified 1971/02/28. Advise whoever placed the stables needs to remove them. Dr. Carola Frost did the surgery and repair - refer him back to ortho.  Seen Rodney on 07/01/2020 for financial assistance. Spoke with [name] told patient to apply for medicaid.  Sent patient  to ED for treatment - does not need to get infected. Patient understand will proceed to ED.

## 2020-07-02 NOTE — Telephone Encounter (Signed)
Sent to PCP ?

## 2020-07-05 ENCOUNTER — Emergency Department (HOSPITAL_COMMUNITY)
Admission: EM | Admit: 2020-07-05 | Discharge: 2020-07-05 | Disposition: A | Discharge status: Home / Self Care | Payer: No Typology Code available for payment source | Attending: Emergency Medicine | Admitting: Emergency Medicine

## 2020-07-05 ENCOUNTER — Encounter (HOSPITAL_COMMUNITY): Payer: Self-pay | Admitting: Obstetrics and Gynecology

## 2020-07-05 ENCOUNTER — Other Ambulatory Visit: Payer: Self-pay

## 2020-07-05 ENCOUNTER — Emergency Department (HOSPITAL_COMMUNITY): Payer: No Typology Code available for payment source

## 2020-07-05 DIAGNOSIS — X58XXXD Exposure to other specified factors, subsequent encounter: Secondary | ICD-10-CM | POA: Insufficient documentation

## 2020-07-05 DIAGNOSIS — Z4802 Encounter for removal of sutures: Secondary | ICD-10-CM | POA: Insufficient documentation

## 2020-07-05 DIAGNOSIS — E119 Type 2 diabetes mellitus without complications: Secondary | ICD-10-CM | POA: Insufficient documentation

## 2020-07-05 DIAGNOSIS — F17210 Nicotine dependence, cigarettes, uncomplicated: Secondary | ICD-10-CM | POA: Insufficient documentation

## 2020-07-05 DIAGNOSIS — S82002E Unspecified fracture of left patella, subsequent encounter for open fracture type I or II with routine healing: Secondary | ICD-10-CM | POA: Insufficient documentation

## 2020-07-05 DIAGNOSIS — Z7984 Long term (current) use of oral hypoglycemic drugs: Secondary | ICD-10-CM | POA: Insufficient documentation

## 2020-07-05 NOTE — ED Triage Notes (Signed)
Patient reports to the ER for stitches removal from left leg.

## 2020-07-05 NOTE — Progress Notes (Signed)
Orthopedic Tech Progress Note Patient Details:  Rodney Pittman [facility] October 14, 1970 [id number]  Ortho Devices Ortho Device/Splint Location: apply knee brace Ortho Device/Splint Interventions: Application, Adjustment   Post Interventions Patient Tolerated: Well Instructions Provided: Care of device   Saul Fordyce 07/05/2020, 6:43 PM

## 2020-07-05 NOTE — ED Provider Notes (Signed)
[facility]-EMERGENCY DEPT Provider Note   CSN: [id number] Arrival date & time: 07/05/20  1713     History Chief Complaint  Patient presents with  . Suture / Staple Removal    Rodney Pittman is a 49 y.o. male presenting to the ED for suture removal.  Patient underwent surgery for an left open patella fracture on September 23.  He has not yet followed up with orthopedist but is concerned because he needed the sutures taken out 2 weeks after the surgery.  States that he has been changing the dressing regularly.  Denies any concerns for infection.  Denies any complaints.  HPI     Past Medical History:  Diagnosis Date  . Avulsion fracture of lateral malleolus of right fibula 06/09/2020  . Diabetes (HCC) 06/09/2020  . Fall 06/07/2020   FALL FROM LADDER   . Open wound of knee, complicated 06/09/2020    Patient Active Problem List   Diagnosis Date Noted  . Diabetes (HCC) 06/09/2020  . Avulsion fracture of lateral malleolus of right fibula 06/09/2020  . Open wound of knee, complicated 06/09/2020  . Open comminuted fracture of left patella 06/08/2020  . Nicotine dependence 06/08/2020  . Fall from height of greater than 3 feet 06/07/2020    Past Surgical History:  Procedure Laterality Date  . APPENDECTOMY    . APPLICATION OF WOUND VAC Left 06/07/2020   APPLICATION OF WOUND VAC (Left Knee)  . APPLICATION OF WOUND VAC Left 06/07/2020   Procedure: APPLICATION OF WOUND VAC;  Surgeon: [name], MD;  Location: [facility] OR;  Service: Orthopedics;  Laterality: Left;  . I & D EXTREMITY Left 06/07/2020   Procedure: IRRIGATION AND DEBRIDEMENT KNEE with REPAIR;  Surgeon: [name], MD;  Location: [facility] OR;  Service: Orthopedics;  Laterality: Left;  . IRRIGATION AND DEBRIDEMENT KNEE Left 06/07/2020   IRRIGATION AND DEBRIDEMENT KNEE POSSIBLE REPAIR (Left Knee)  . ORIF PATELLA Left 06/07/2020   OPEN REDUCTION INTERNAL (ORIF) FIXATION PATELLA (Knee)  . ORIF  PATELLA  06/07/2020   Procedure: OPEN REDUCTION INTERNAL (ORIF) FIXATION PATELLA;  Surgeon: [name], MD;  Location: [facility] OR;  Service: Orthopedics;;       Family History  Problem Relation Age of Onset  . Diabetes Mother     Social History   Tobacco Use  . Smoking status: Current Every Day Smoker    Packs/day: 0.15    Types: Cigarettes  . Smokeless tobacco: Never Used  . Tobacco comment: 3 PER DAY  Vaping Use  . Vaping Use: Never used  Substance Use Topics  . Alcohol use: Yes    Comment: [facility]  . Drug use: Never    Home Medications Prior to Admission medications   Medication Sig Start Date End Date Taking? Authorizing Provider  acetaminophen (TYLENOL) 500 MG tablet Take 1 tablet (500 mg total) by mouth every 12 (twelve) hours. 06/09/20   Montez Morita, PA-C  ascorbic acid (VITAMIN C) 500 MG tablet Take 2 tablets (1,000 mg total) by mouth daily. 06/10/20 08/09/20  Montez Morita, PA-C  Cholecalciferol (VITAMIN D) 125 MCG (5000 UT) CAPS Take 1 capsule by mouth daily. 06/09/20   Montez Morita, PA-C  docusate sodium (COLACE) 100 MG capsule Take 1 capsule (100 mg total) by mouth 2 (two) times daily. 06/18/20   [name], NP  enoxaparin (LOVENOX) 40 MG/0.4ML injection Inject 0.4 mLs (40 mg total) into the skin daily for 14 days. 06/10/20 06/24/20  Montez Morita, PA-C  metFORMIN (GLUCOPHAGE  XR) 500 MG 24 hr tablet Take 1 tablet (500 mg total) by mouth daily with breakfast. 06/18/20   [name], NP  methocarbamol (ROBAXIN) 500 MG tablet Take 1-2 tablets (500-1,000 mg total) by mouth every 6 (six) hours as needed for muscle spasms. 06/18/20   [name], NP  Multiple Vitamin (MULTIVITAMIN WITH MINERALS) TABS tablet Take 1 tablet by mouth daily. Patient not taking: Reported on 06/18/2020 06/10/20 09/08/20  Montez Morita, PA-C  oxyCODONE-acetaminophen (PERCOCET) 5-325 MG tablet Take 1-2 tablets by mouth every 6 (six) hours as needed for moderate pain or severe pain. 06/09/20  06/09/21  Montez Morita, PA-C  polyethylene glycol (MIRALAX / GLYCOLAX) 17 g packet Take 17 g by mouth daily. 06/10/20   Montez Morita, PA-C  traMADol (ULTRAM) 50 MG tablet Take 1 tablet (50 mg total) by mouth every 8 (eight) hours as needed for moderate pain. 06/10/20   Montez Morita, PA-C    Allergies    Patient has no known allergies.  Review of Systems   Review of Systems  Constitutional: Negative for chills and fever.  Skin: Positive for wound.  Neurological: Negative for weakness and numbness.    Physical Exam Updated Vital Signs BP (!) 146/90 (BP Location: Left Arm)   Pulse 89   Temp 98.1 F (36.7 C) (Oral)   Resp 18   SpO2 99%   Physical Exam Vitals and nursing note reviewed.  Constitutional:      General: He is not in acute distress.    Appearance: He is well-developed. He is not diaphoretic.  HENT:     Head: Normocephalic and atraumatic.  Eyes:     General: No scleral icterus.    Conjunctiva/sclera: Conjunctivae normal.  Pulmonary:     Effort: Pulmonary effort is normal. No respiratory distress.  Musculoskeletal:     Cervical back: Normal range of motion.  Skin:    Findings: Wound present. No rash.     Comments: 15 sutures in place noted on wound.  Please see images.  Neurological:     Mental Status: He is alert.         ED Results / Procedures / Treatments   Labs (all labs ordered are listed, but only abnormal results are displayed) Labs Reviewed - No data to display  EKG None  Radiology DG Knee Complete 4 Views Left  Result Date: 07/05/2020 CLINICAL DATA:  History of prior patellar surgery EXAM: LEFT KNEE - COMPLETE 4+ VIEW COMPARISON:  06/07/2020 FINDINGS: Postsurgical changes are noted in the patella with 2 fixation screws. The fracture fragments are in near anatomic alignment. Mild degenerative changes are noted particularly in the patellofemoral space. No acute fracture is noted. IMPRESSION: Status post patellar fixation. Electronically Signed    By: Alcide Clever M.D.   On: 07/05/2020 19:08    Procedures .Suture Removal  Date/Time: 07/05/2020 6:58 PM Performed by: Dietrich Pates, PA-C Authorized by: Dietrich Pates, PA-C   Consent:    Consent obtained:  Verbal   Consent given by:  Patient and parent   Risks discussed:  Bleeding, pain and wound separation   Alternatives discussed:  No treatment Location:    Location:  Lower extremity   Lower extremity location:  Knee   Knee location:  L knee Procedure details:    Wound appearance:  No signs of infection, good wound healing and clean   Number of sutures removed:  15 Post-procedure details:    Post-removal:  No dressing applied   Patient tolerance of  procedure:  Tolerated well, no immediate complications   (including critical care time)  Medications Ordered in ED Medications - No data to display  ED Course  I have reviewed the triage vital signs and the nursing notes.  Pertinent labs & imaging results that were available during my care of the patient were reviewed by me and considered in my medical decision making (see chart for details).  Clinical Course as of Jul 05 1913  Mon Jul 05, 2020  9767 I spoke to Dr. Carola Frost, orthopedic surgeon who did his surgery 1 month ago.  States it is okay for [place] to take his sutures out and he is requesting a repeat x-ray prior to discharge.  We will tell the patient he can follow-up with him in 2 days in the office.   [HK]    Clinical Course User Index [HK] Dietrich Pates, PA-C   MDM Rules/Calculators/A&P                          49 year old male presenting to the ED for suture removal.  He had surgery for an open patella fracture on 06/10/2020.  He has not yet followed up with orthopedist but was told he need to have his sutures removed in 2 weeks.  He reports dressing changes as instructed.  Physical exam findings noted above.  I spoke to Dr. Carola Frost, the surgeon who did his repair and states that it is okay for me to take the sutures out.   He is also asked for a knee x-ray that he can follow-up in his clinic later on in the week.  Patient tolerated procedure without difficulty.  I gave him information regarding follow-up with Dr. Carola Frost and he is agreeable.  We will have him continue wearing the knee immobilizer.    Patient is hemodynamically stable, in NAD, and able to ambulate in the ED. Evaluation does not show pathology that would require ongoing emergent intervention or inpatient treatment. I explained the diagnosis to the patient. Pain has been managed and has no complaints prior to discharge. Patient is comfortable with above plan and is stable for discharge at this time. All questions were answered prior to disposition. Strict return precautions for returning to the ED were discussed. Encouraged follow up with PCP.   An After Visit Summary was printed and given to the patient.   Portions of this note were generated with Scientist, clinical (histocompatibility and immunogenetics). Dictation errors may occur despite best attempts at proofreading.  Final Clinical Impression(s) / ED Diagnoses Final diagnoses:  Visit for suture removal    Rx / DC Orders ED Discharge Orders    None       Dietrich Pates, PA-C 07/05/20 1914    Wynetta Fines, MD 07/19/20 2030

## 2020-07-05 NOTE — Discharge Instructions (Addendum)
Follow-up with Dr. Magdalene Patricia office. You can call them tomorrow for an appointment on Wednesday. Return to the ER for signs of infection including redness, swelling, drainage or fever.   Seguimiento con la oficina del Dr. Carola Frost. Puede llamarlos maana para concertar una cita el mircoles. Regrese a la sala de emergencias para detectar signos de infeccin, como enrojecimiento, hinchazn, supuracin o fiebre.

## 2020-07-12 ENCOUNTER — Encounter: Payer: Self-pay | Admitting: Registered"

## 2020-07-12 ENCOUNTER — Encounter: Payer: Self-pay | Attending: Orthopedic Surgery | Admitting: Registered"

## 2020-07-12 ENCOUNTER — Other Ambulatory Visit: Payer: Self-pay

## 2020-07-12 DIAGNOSIS — E119 Type 2 diabetes mellitus without complications: Secondary | ICD-10-CM | POA: Insufficient documentation

## 2020-07-12 NOTE — Progress Notes (Signed)
Diabetes Self-Management Education  Visit Type: First/Initial  Appt. Start Time: 1430 Appt. End Time: 1530  07/16/2020  [name], identified by name and date of birth, is a 49 y.o. male with a diagnosis of Diabetes: Type 2.   This patient is accompanied in the office by his mother-in-law who does some of the cooking for the family, and [facility] in-person interpreter. Patient speaks some [place].  ASSESSMENT  Pt states he was told the reason he was only given Rx for 1 month supply of metformin is because he is on the borderline of diabetes and that with the trama he had from the fall could have affected the blood sugar test.   Patient was very interested in learning about the process of diagnosis diabetes and the lab values. Pt states his mother-in-law has been trying to limit fruit and other carbs to help him manage is blood sugar.   Patient states he is very active normally and even with the cast on his leg he walks and stays moving.   Diabetes Self-Management Education - 07/14/20 1122      Visit Information   Visit Type First/Initial      Initial Visit   Diabetes Type Type 2    Are you currently following a meal plan? No    Are you taking your medications as prescribed? Not on Medications   was only given 1 month rx of metformin   Date Diagnosed 06/07/20      Health Coping   How would you rate your overall health? Good      Psychosocial Assessment   Patient Belief/Attitude about Diabetes Denial    Self-care barriers English as a second language    How often do you need to have someone help you when you read instructions, pamphlets, or other written materials from your doctor or pharmacy? 5 - Always      Complications   Last HgB A1C per patient/outside source 6.8 %    How often do you check your blood sugar? 0 times/day (not testing)      Exercise   Exercise Type Other (comment)   active at work, doesn't "like to sit still"     Patient Education    Previous Diabetes Education No    Disease state  Definition of diabetes, type 1 and 2, and the diagnosis of diabetes    Nutrition management  Role of diet in the treatment of diabetes and the relationship between the three main macronutrients and blood glucose level;Carbohydrate counting    Physical activity and exercise  Role of exercise on diabetes management, blood pressure control and cardiac health.    Medications Reviewed patients medication for diabetes, action, purpose, timing of dose and side effects.    Monitoring Interpreting lab values - A1C, lipid, urine microalbumina.   discuss difference between A1c and SMBG     Individualized Goals (developed by patient)   Nutrition General guidelines for healthy choices and portions discussed      Outcomes   Expected Outcomes Demonstrated interest in learning. Expect positive outcomes    Future DMSE PRN    Program Status Completed           Individualized Plan for Diabetes Self-Management Training:   Learning Objective:  Patient will have a greater understanding of diabetes self-management. Patient education plan is to attend individual and/or group sessions per assessed needs and concerns.   Patient Instructions  Aim to eat balanced meals and snacks using the Planning Healthy meals  handout as a guide. Continue to stay active as tolerated Follow-up with your primary care for regular visits to manage your diabetes   Expected Outcomes:  Demonstrated interest in learning. Expect positive outcomes  Education material provided: A1C conversion sheet, Planning Healthy Meals  If problems or questions, patient to contact team via:  Phone and Mychart  Future DSME appointment: PRN

## 2020-07-16 NOTE — Patient Instructions (Signed)
Aim to eat balanced meals and snacks using the Planning Healthy meals handout as a guide. Continue to stay active as tolerated Follow-up with your primary care for regular visits to manage your diabetes

## 2020-07-20 ENCOUNTER — Other Ambulatory Visit (INDEPENDENT_AMBULATORY_CARE_PROVIDER_SITE_OTHER): Payer: Self-pay | Admitting: Primary Care

## 2020-07-20 DIAGNOSIS — E119 Type 2 diabetes mellitus without complications: Secondary | ICD-10-CM

## 2020-07-20 DIAGNOSIS — W1789XA Other fall from one level to another, initial encounter: Secondary | ICD-10-CM

## 2020-07-20 MED ORDER — METFORMIN HCL ER 500 MG PO TB24: 500.0000 mg | ORAL_TABLET | Freq: Every day | ORAL | 0 refills | Status: DC

## 2020-07-20 MED ORDER — METHOCARBAMOL 500 MG PO TABS: 500.0000 mg | ORAL_TABLET | Freq: Four times a day (QID) | ORAL | 1 refills | Status: DC | PRN

## 2020-07-23 MED FILL — metFORMIN HCL ER 500 MG TB2: 500 | 30 days supply | Qty: 30 | Fill #1

## 2020-12-18 ENCOUNTER — Other Ambulatory Visit: Payer: Self-pay

## 2021-05-24 ENCOUNTER — Ambulatory Visit (INDEPENDENT_AMBULATORY_CARE_PROVIDER_SITE_OTHER): Payer: Self-pay | Admitting: *Deleted

## 2021-05-24 NOTE — Telephone Encounter (Signed)
FYI. Or advise.

## 2021-05-24 NOTE — Telephone Encounter (Signed)
Patient experiencing kidney pain for 2 days seeking clinical advice.   Called patient via interpretor ID [id number]. C/o right upper back pain towards hip. Constant pain sometimes "like a knife" x 1 week ago . Right  leg numbness reported denies weakness in right leg. Naproxen not relieving pain, eases but does not go away. Denies abdominal pain, no difficulty urinating or burning . No vomiting , no fever, denies rash to back or pain in groin area. Pain level 7-8 /10. Earliest appt scheduled for 06/13/21. Encouraged patient if pain worse go to ED. Care advise given. Patient verbalized understanding of care advise and to call back or  go to [facility] or ED if symptoms worsen. Please advise

## 2021-05-24 NOTE — Telephone Encounter (Signed)
Reason for Disposition  MODERATE pain (e.g., interferes with normal activities or awakens from sleep)  Answer Assessment - Initial Assessment Questions 1. LOCATION: "Where does it hurt?" (e.g., left, right)     Right back upper area towards hip  2. ONSET: "When did the pain start?"     1 week ago now kidney pain x 2 days ago  3. SEVERITY: "How bad is the pain?" (e.g., Scale 1-10; mild, moderate, or severe)   - MILD (1-3): doesn't interfere with normal activities    - MODERATE (4-7): interferes with normal activities or awakens from sleep    - SEVERE (8-10): excruciating pain and patient unable to do normal activities (stays in bed)       Pain 7-8 . Feels like a knife . Can not lay down worsening pain when not moving  4. PATTERN: "Does the pain come and go, or is it constant?"    . Worse when not moving, constant  5. CAUSE: "What do you think is causing the pain?"     Drinking too many sodas.  6. OTHER SYMPTOMS:  "Do you have any other symptoms?" (e.g., fever, abdominal pain, vomiting, leg weakness, burning with urination, blood in urine)     Denies  7. PREGNANCY:  "Is there any chance you are pregnant?" "When was your last menstrual period?"     na  Protocols used: [facility]-AH

## 2021-06-02 ENCOUNTER — Ambulatory Visit: Payer: Self-pay

## 2021-06-02 ENCOUNTER — Other Ambulatory Visit: Payer: Self-pay

## 2021-06-02 ENCOUNTER — Encounter: Payer: Self-pay | Admitting: Surgery

## 2021-06-02 ENCOUNTER — Ambulatory Visit (INDEPENDENT_AMBULATORY_CARE_PROVIDER_SITE_OTHER): Payer: Self-pay | Admitting: Surgery

## 2021-06-02 VITALS — BP 130/82 | HR 87 | Ht 71.0 in | Wt 270.2 lb

## 2021-06-02 DIAGNOSIS — M25562 Pain in left knee: Secondary | ICD-10-CM

## 2021-06-02 DIAGNOSIS — Z8781 Personal history of (healed) traumatic fracture: Secondary | ICD-10-CM

## 2021-06-02 NOTE — Progress Notes (Signed)
Office Visit Note   Patient: Rodney Pittman           Date of Birth: 1970-09-28           MRN: [id number] Visit Date: 06/02/2021              Requested by: [name], NP [address] [place],  [place] [address] PCP: [name], NP   Assessment & Plan: Visit Diagnoses:  1. Left knee pain, unspecified chronicity   2. Hx of fracture of patella     Plan: Advised patient that he has a fairly complicated problem.  Patient had wanted to be scheduled for formal PT but I think he would be in his best interest to see Dr. Dorene Grebe here in our office to discuss possible scheduling surgical revision versus continuing conservative management.  All questions answered.  Follow-Up Instructions: Return in about 1 week (around 06/09/2021) for with Dr August Saucer consult left knee.  discuss options  .   Orders:  Orders Placed This Encounter  Procedures   XR KNEE 3 VIEW LEFT   No orders of the defined types were placed in this encounter.     Procedures: No procedures performed   Clinical Data: No additional findings.   Subjective: Chief Complaint  Patient presents with   Left Knee - New Patient (Initial Visit)    HPI 50 year old male is new patient to clinic comes in today with complaints of ongoing left knee pain and stiffness.  I reviewed patient's chart and he is status post irrigation and debridement left knee and ORIF left patella with application of wound VAC by Dr. [name] for open fracture June 07, 2020.  Patient's knee injury was result of him falling off a 22 foot ladder.  Reviewing patient's notes after the injury he was found to have an open left patella fracture as well as a right fibular avulsion fracture.  Patient states that he did go to some formal PT and would like to resume this.  He has not seen Dr. Carola Frost in a while.  Has ongoing left knee pain and stiffness.  Aggravated with just about all activities. Review of Systems No current  cardiopulmonary GI GU issues  Objective: Vital Signs: BP 130/82 (BP Location: Left Arm, Patient Position: Sitting, Cuff Size: Normal)   Pulse 87   Ht 5\' 11"  (1.803 m)   Wt 270 lb 3.2 oz (122.6 kg)   SpO2 97%   BMI 37.69 kg/m   Physical Exam HENT:     Head: Normocephalic.  Eyes:     Extraocular Movements: Extraocular movements intact.  Musculoskeletal:     Cervical back: Tenderness present.     Comments: Gait is antalgic.  Patient has large scar anterior knee.  Knee range of motion about 0 to 80 degrees.  He is diffusely tender.  No signs of infection.  Some swelling.  Neurological:     Mental Status: He is alert and oriented to person, place, and time.  Psychiatric:        Mood and Affect: Mood normal.    Ortho Exam  Specialty Comments:  No specialty comments available.  Imaging: No results found.   PMFS History: Patient Active Problem List   Diagnosis Date Noted   Newly diagnosed diabetes (HCC) 06/09/2020   Avulsion fracture of lateral malleolus of right fibula 06/09/2020   Open wound of knee, complicated 06/09/2020   Open comminuted fracture of left patella 06/08/2020   Nicotine dependence 06/08/2020  Fall from height of greater than 3 feet 06/07/2020   Past Medical History:  Diagnosis Date   Avulsion fracture of lateral malleolus of right fibula 06/09/2020   Diabetes (HCC) 06/09/2020   Fall 06/07/2020   FALL FROM LADDER    Open wound of knee, complicated 06/09/2020    Family History  Problem Relation Age of Onset   Diabetes Mother     Past Surgical History:  Procedure Laterality Date   APPENDECTOMY     APPLICATION OF WOUND VAC Left 06/07/2020   APPLICATION OF WOUND VAC (Left Knee)   APPLICATION OF WOUND VAC Left 06/07/2020   Procedure: APPLICATION OF WOUND VAC;  Surgeon: [name], MD;  Location: [facility] OR;  Service: Orthopedics;  Laterality: Left;   I & D EXTREMITY Left 06/07/2020   Procedure: IRRIGATION AND DEBRIDEMENT KNEE with REPAIR;  Surgeon:  [name], MD;  Location: [facility] OR;  Service: Orthopedics;  Laterality: Left;   IRRIGATION AND DEBRIDEMENT KNEE Left 06/07/2020   IRRIGATION AND DEBRIDEMENT KNEE POSSIBLE REPAIR (Left Knee)   ORIF PATELLA Left 06/07/2020   OPEN REDUCTION INTERNAL (ORIF) FIXATION PATELLA (Knee)   ORIF PATELLA  06/07/2020   Procedure: OPEN REDUCTION INTERNAL (ORIF) FIXATION PATELLA;  Surgeon: [name], MD;  Location: [facility] OR;  Service: Orthopedics;;   Social History   Occupational History   Not on file  Tobacco Use   Smoking status: Every Day    Packs/day: 0.15    Types: Cigarettes   Smokeless tobacco: Never   Tobacco comments:    3 PER DAY  Vaping Use   Vaping Use: Never used  Substance and Sexual Activity   Alcohol use: Yes    Comment: [facility]   Drug use: Never   Sexual activity: Not on file

## 2021-06-13 ENCOUNTER — Ambulatory Visit (INDEPENDENT_AMBULATORY_CARE_PROVIDER_SITE_OTHER): Payer: Self-pay | Admitting: Primary Care

## 2021-06-13 ENCOUNTER — Other Ambulatory Visit: Payer: Self-pay

## 2021-06-13 VITALS — BP 154/82 | HR 73 | Temp 97.5°F | Ht 71.0 in | Wt 271.0 lb

## 2021-06-13 DIAGNOSIS — R109 Unspecified abdominal pain: Secondary | ICD-10-CM

## 2021-06-13 DIAGNOSIS — Z6837 Body mass index (BMI) 37.0-37.9, adult: Secondary | ICD-10-CM

## 2021-06-13 DIAGNOSIS — F17210 Nicotine dependence, cigarettes, uncomplicated: Secondary | ICD-10-CM

## 2021-06-13 DIAGNOSIS — E119 Type 2 diabetes mellitus without complications: Secondary | ICD-10-CM

## 2021-06-13 DIAGNOSIS — F17200 Nicotine dependence, unspecified, uncomplicated: Secondary | ICD-10-CM

## 2021-06-13 DIAGNOSIS — E6609 Other obesity due to excess calories: Secondary | ICD-10-CM

## 2021-06-13 DIAGNOSIS — I10 Essential (primary) hypertension: Secondary | ICD-10-CM

## 2021-06-13 LAB — POCT GLYCOSYLATED HEMOGLOBIN (HGB A1C): Hemoglobin A1C: 7.2 % — AB (ref 4.0–5.6)

## 2021-06-13 MED ORDER — HYDROCHLOROTHIAZIDE 25 MG PO TABS: 25.0000 mg | ORAL_TABLET | Freq: Every day | ORAL | 3 refills | Status: AC

## 2021-06-13 MED ORDER — LISINOPRIL 20 MG PO TABS: 20.0000 mg | ORAL_TABLET | Freq: Every day | ORAL | 3 refills | Status: AC

## 2021-06-13 MED ORDER — GLIPIZIDE 10 MG PO TABS: 10.0000 mg | ORAL_TABLET | Freq: Two times a day (BID) | ORAL | 3 refills | Status: AC

## 2021-06-13 MED ORDER — METFORMIN HCL ER 500 MG PO TB24: ORAL_TABLET | Freq: Every day | ORAL | 0 refills | Status: DC

## 2021-06-13 NOTE — Patient Instructions (Addendum)
Recuento de caloras para bajar de peso Calorie Counting for [employer] Loss Las caloras son unidades de Teacher, early years/pre. El cuerpo necesita una cierta cantidad de caloras de los alimentos para que lo ayuden a funcionar durante todo Games developer. Cuando se comen o beben ms caloras de las que el cuerpo [place], este acumula las caloras adicionales mayormente como grasa. Cuando se comen o beben menos caloras de las que el cuerpo [place], este quema grasa para obtener la energa que necesita. El recuento de caloras es el registro de la cantidad de caloras que se comen y English as a second language teacher. El recuento de caloras puede ser de ayuda si necesita perder peso. Si come menos caloras de las que el cuerpo necesita, debera bajar de [place]. Pregntele al mdico cul es un peso sano para usted. Para que el recuento de caloras funcione, usted tendr que ingerir la cantidad de caloras adecuadas cada da, para bajar una cantidad de peso saludable por semana. Un nutricionista puede ayudar a determinar la cantidad de caloras que usted necesita por da y sugerirle formas de Science writer su objetivo calrico. Ardelia Mems cantidad de peso saludable para bajar cada semana suele ser entre 1 y 2 libras (0.5 a 0.9 kg). Esto habitualmente significa que su ingesta diaria de caloras se debera reducir en unas 500 a 750 caloras. Ingerir de 1200 a 1500 caloras por Administrator, Civil Service a la [employer] de las mujeres a Sports coach de [place]. Ingerir de 1500 a 1800 caloras por Administrator, Civil Service a la [employer] de los hombres a Sports coach de [place]. Qu debo saber acerca del recuento de caloras? Trabaje con el mdico o el nutricionista para determinar cuntas caloras debe recibir Armed forces operational officer. A fin de alcanzar su objetivo diario de caloras, tendr que: Averiguar cuntas caloras hay en cada alimento que le Therapist, occupational. Intente hacerlo antes de comer. Decidir la cantidad que puede comer del alimento. Llevar un registro de los alimentos. Para esto, anote lo que comi y cuntas  caloras tena. Para perder peso con xito, es importante equilibrar el recuento de caloras con un estilo de vida saludable que incluya actividad fsica de forma regular. Dnde encuentro informacin sobre las caloras? Es posible Animator cantidad de caloras que contiene un alimento en la etiqueta de informacin nutricional. Si un alimento no tiene una etiqueta de informacin nutricional, intente buscar las caloras en Internet o pida ayuda al nutricionista. Recuerde que las caloras se calculan por porcin. Si opta por comer ms de una porcin de un alimento, tendr [employer] Inc las caloras de una porcin por la cantidad de porciones que planea comer. Por ejemplo, la etiqueta de un envase de pan puede decir que el tamao de una porcin es 1 rodaja, y que una porcin tiene 90 caloras. Si come 1 rodaja, habr comido 90 caloras. Si come 2 rodajas, habr comido 180 caloras. Cmo llevo un registro de comidas? Despus de cada vez que coma, anote lo siguiente en el registro de alimentos lo antes posible: Lo que comi. Asegrese de [employer], las salsas y otros extras Merck & Co. La cantidad que comi. Esto se puede medir en tazas, onzas o cantidad de alimentos. Cuntas caloras haba en cada alimento y en cada bebida. La cantidad total de caloras en la comida que tom. Tenga a Materials engineer de alimentos, por ejemplo, en un anotador de bolsillo o utilice una aplicacin o sitio web en el telfono mvil. Algunos programas calcularn las caloras por usted y Automotive engineer la cantidad de caloras  que le quedan para llegar al objetivo diario. Cules son algunos consejos para controlar las porciones? Sepa cuntas caloras hay en una porcin. Esto lo ayudar a saber cuntas porciones de un alimento determinado puede comer. Use una taza medidora para medir los tamaos de las porciones. Tambin Secondary school teacher las porciones en una balanza de cocina. Con el tiempo, podr hacer  un clculo estimativo de los tamaos de las porciones de algunos alimentos. Dedique tiempo a poner porciones de diferentes alimentos en sus platos, tazones y tazas predilectos, a fin de saber cmo se ve una porcin. Intente no comer directamente de un envase de alimentos, por ejemplo, de una bolsa o una caja. Comer directamente del envase dificulta ver cunto est comiendo y puede conducir a Photographer. Ponga la cantidad [employer] gustara comer en una taza o un plato, a fin de asegurarse de que est comiendo la porcin correcta. Use platos, vasos y tazones ms pequeos para medir porciones ms pequeas y Product/process development scientist no comer en exceso. Intente no realizar varias tareas al [employer]. Por ejemplo, evite mirar televisin o usar la [employer] Corporation come. Si es la hora de comer, sintese a Conservation officer, nature y disfrute de Environmental education officer. Esto lo ayudar a Marine scientist cundo est satisfecho. Tambin le permitir estar ms consciente de qu come y cunto come. Consejos para seguir Catering manager Al leer las etiquetas de los alimentos Controle el recuento de caloras en comparacin con el tamao de la porcin. El tamao de la porcin puede ser ms pequeo de lo que suele comer. Verifique la fuente de las caloras. Intente elegir alimentos ricos en protenas, fibras y vitaminas, y bajos en grasas saturadas, grasas trans y [place]. Al ir de compras Lea las etiquetas nutricionales cuando compre. Esto lo ayudar a tomar decisiones saludables sobre qu alimentos comprar. Preste atencin a las etiquetas nutricionales de alimentos bajos en grasas o sin grasas. Estos alimentos a veces tienen la misma cantidad de caloras o ms caloras que las versiones ricas en grasas. Con frecuencia, tambin tienen agregados de azcar, almidn o sal, para darles el sabor que fue eliminado con las grasas. Haga una lista de compras con los alimentos que tienen un menor contenido de caloras y Freight forwarder. Al cocinar Intente cocinar sus alimentos preferidos  de una manera ms saludable. Por ejemplo, pruebe hornear en vez de frer. Utilice productos lcteos descremados. Planificacin de las comidas Utilice ms frutas y verduras. La mitad de su plato debe ser de frutas y verduras. Incluya protenas magras, como pollo, pavo y [place]. Estilo de [employer], trate de hacer una de las siguientes cosas: 150 minutos de ejercicio moderado, como caminar. 75 minutos de ejercicio enrgico, como correr. Informacin general Sepa cuntas caloras tienen los alimentos que come con ms frecuencia. Esto le ayudar a contar las caloras ms rpidamente. Encuentre un mtodo para controlar las caloras que funcione para usted. Sea creativo. Pruebe aplicaciones o programas distintos, si llevar un registro de las caloras no funciona para usted. Qu alimentos debo consumir?  Consuma alimentos nutritivos. Es mejor comer un alimento nutritivo, de alto contenido calrico, como un aguacate, que uno con pocos nutrientes, como una bolsa de patatas fritas. Use sus caloras en alimentos y bebidas que lo sacien y no lo dejen con apetito apenas termina de comer. Ejemplos de alimentos que lo sacian son los frutos secos y Engineer, mining de frutos secos, verduras, Advertising account planner y Clinical research associate con alto contenido de Pharmacist, hospital como los cereales integrales. Los alimentos con [employer]  de [place] son aquellos que tienen ms de 5 g de fibra por porcin. Preste atencin a las Automatic Data. Las bebidas de bajas caloras incluyen agua y refrescos sin Location manager. Es posible que los productos que se enumeran ms [place] no constituyan una lista completa de los alimentos y las bebidas que puede tomar. Consulte a un nutricionista para obtener ms informacin. Qu alimentos debo limitar? Limite el consumo de alimentos o bebidas que no sean buenas fuentes de vitaminas, minerales o protenas, o que tengan alto contenido de grasas no saludables. Estos incluyen: Caramelos. Otros  dulces. Refrescos, bebidas con caf especiales, alcohol y [place]. Es posible que los productos que se enumeran ms [place] no constituyan una lista completa de los alimentos y las bebidas que Nurse, adult. Consulte a un nutricionista para obtener ms informacin. Cmo puedo hacer el recuento de caloras cuando como afuera? Preste atencin a las porciones. A menudo, las porciones son mucho ms grandes al comer afuera. Pruebe con estos consejos para mantener las porciones ms pequeas: Considere la posibilidad de compartir una comida en lugar de tomarla toda usted solo. Si pide su propia comida, coma solo la mitad. Antes de empezar a comer, pida un recipiente y ponga la mitad de la comida en l. Cuando sea posible, considere la posibilidad de pedir porciones ms pequeas del men en lugar de porciones completas. Preste atencin a Marine scientist de alimentos y bebidas. Saber la forma en que se cocinan los alimentos y lo que incluye la comida puede ayudarlo a ingerir menos caloras. Si se detallan las caloras en el men, elija las opciones que contengan la menor cantidad. Elija platos que incluyan verduras, frutas, cereales integrales, productos lcteos con bajo contenido de grasa y Advertising account planner. Opte por los alimentos hervidos, asados, cocidos a la parrilla o al vapor. Evite los alimentos a los que se les ponga mantequilla, que estn empanados o fritos, o que se sirvan con salsa a base de crema. Generalmente, los alimentos que se etiquetan como "crujientes" estn fritos, a menos que se indique lo contrario. Elija el agua, la [place], [place] t helado sin azcar u otras bebidas que no contengan azcares agregados. Si desea una bebida alcohlica, escoja una opcin con menos caloras, como una copa de vino o una cerveza ligera. Ordene los Kimberly[employer], las salsas y los jarabes aparte. Estos son, con frecuencia, de alto contenido en caloras, por lo que debe limitar la cantidad que ingiere. Si desea [place]le, elija una de hortalizas y pida carnes a la parrilla. Evite las guarniciones adicionales como el tocino, el queso o los alimentos fritos. Ordene el aderezo aparte o pida aceite de [place] y vinagre o limn para Haematologist. Haga un clculo estimativo de la cantidad de porciones que le sirven. Conocer el tamao de las porciones lo ayudar a Personnel officer atento a la cantidad de comida que come [employer] Petroleum. Dnde buscar ms informacin Centers for Disease Control and Prevention (Centros para el Control y [place]): http://www.wolf.info/ U.S. Department of Agriculture (Departamento de Agricultura de los EE. UU.): http://www.wilson-mendoza.org/ Resumen El recuento de caloras es el registro de la cantidad de caloras que se comen y English as a second language teacher. Si come menos caloras de las que el cuerpo necesita, debera bajar de [place]. Una cantidad de peso saludable para bajar por semana suele ser entre 1 y 2 libras (0.5 a 0.9 kg). Esto significa, con frecuencia, reducir su ingesta diaria de caloras unas 500 a 750 caloras. Es posible Pension scheme manager  la cantidad de caloras que contiene un alimento en la etiqueta de informacin nutricional. Si un alimento no tiene una etiqueta de informacin nutricional, intente buscar las caloras en Internet o pida ayuda al nutricionista. Use platos, vasos y tazones ms pequeos para medir porciones ms pequeas y Automotive engineer no comer en exceso. Use sus caloras en alimentos y bebidas que lo sacien y no lo dejen con apetito poco tiempo despus de haber comido. Esta informacin no tiene Theme park manager el consejo del mdico. Asegrese de hacerle al mdico cualquier pregunta que tenga. Document Revised: 12/29/2019 Document Reviewed: 12/29/2019 Elsevier Patient Education  2022 Elsevier Inc. Dolor de espalda agudo en los adultos Acute Back Pain, Adult El dolor de espalda agudo es repentino y por lo general no dura mucho tiempo. Se debe generalmente a una lesin de los msculos y tejidos de la  espalda. La lesin puede ser el resultado de: Estiramiento en exceso o desgarro de un msculo, tendn o ligamento. Los ligamentos son tejidos que [employer]. Levantar algo de forma incorrecta puede producir un esguince de espalda. Desgaste (degeneracin) de los discos vertebrales. Los discos vertebrales son tejidos circulares que proporcionan amortiguacin entre los huesos de la columna vertebral (vrtebras). Movimientos de giro, como al practicar deportes o realizar trabajos de [place]. Un golpe en la espalda. Artritis. Es posible Producer, television/film/video un examen fsico, anlisis de laboratorio u otros estudios de diagnstico por imgenes para Veterinary surgeon causa del Engineer, mining. El dolor de espalda agudo generalmente desaparece con reposo y cuidados en la casa. Siga estas instrucciones en su casa: Control del dolor, la rigidez y Architect los medicamentos de venta libre y los recetados solamente como se lo haya indicado el mdico. El tratamiento puede incluir medicamentos para Chief Technology Officer y la inflamacin que se toman por la boca o que se aplican sobre la piel, o relajantes musculares. El mdico puede recomendarle que se aplique hielo durante las primeras 24 a 48 horas despus del comienzo del Engineer, mining. Para hacer esto: Ponga el hielo en una bolsa plstica. Coloque una toalla entre la piel y Copy. Aplique el hielo durante 20 minutos, 2 o 3 veces por da. Retire el hielo si la piel se pone de color rojo brillante. Esto es Intel. Si no puede sentir dolor, calor o fro, tiene un mayor riesgo de que se dae la zona. Si se lo indican, aplique calor en la zona afectada con la frecuencia que le haya indicado el mdico. Use la fuente de calor que el mdico le recomiende, como una compresa de calor hmedo o una almohadilla trmica. Coloque una toalla entre la piel y la fuente de Airline pilot. Aplique calor durante 20 a 30 minutos. Retire la fuente de calor si la piel se pone de color rojo brillante.  Esto es especialmente importante si no puede sentir dolor, calor o fro. Corre un mayor riesgo de sufrir quemaduras. Actividad  No permanezca en la cama. Hacer reposo en la cama por ms de 1 a 2 das puede demorar su recuperacin. Mantenga una buena postura al sentarse y pararse. No se incline hacia adelante al sentarse ni se encorve al pararse. Si trabaja en un escritorio, sintese cerca de este para no tener que inclinarse. Mantenga el mentn hacia abajo. Mantenga el cuello hacia atrs y los codos flexionados en un ngulo de 90 grados (ngulo recto). Cuando conduzca, sintese elevado y cerca del volante. Agregue un apoyo para la espalda (lumbar) al asiento del automvil, si es necesario.  Realice caminatas cortas en superficies planas tan pronto como le sea posible. Trate de caminar un poco ms de Pharmacist, community. No se siente, conduzca o permanezca de pie en un mismo lugar durante ms de 30 minutos seguidos. Pararse o sentarse durante largos perodos de Contractor la espalda. No conduzca ni use maquinaria pesada mientras toma analgsicos recetados. Use tcnicas apropiadas para levantar objetos. Cuando se inclina y Solicitor un [place], utilice posiciones que no sobrecarguen tanto la espalda: Flexione las rodillas. Mantenga la carga cerca del cuerpo. No se tuerza. Haga actividad fsica habitualmente como se lo haya indicado el mdico. Hacer ejercicios ayuda a que la espalda sane ms rpido y [place] a Automotive engineer las lesiones de la espalda al [employer] msculos fuertes y flexibles. Trabaje con un fisioterapeuta para crear un programa de ejercicios seguros, segn lo recomiende el mdico. Haga ejercicios como se lo haya indicado el fisioterapeuta. Estilo de vida Mantenga un peso saludable. El sobrepeso sobrecarga la espalda y hace que resulte difcil tener una buena [place]. Evite actividades o situaciones que lo hagan sentirse ansioso o estresado. El estrs y la ansiedad aumentan la tensin  muscular y pueden empeorar el dolor de espalda. Aprenda formas de [employer] ansiedad y [place], como a travs del ejercicio. Instrucciones generales [place] sobre un colchn firme en una posicin cmoda. Intente acostarse de costado, con las rodillas ligeramente flexionadas. Si se recuesta Fisher Scientific, coloque una almohada debajo de las rodillas. Mantenga la cabeza y el cuello en lnea recta con la columna vertebral (posicin neutra) cuando use equipos electrnicos como telfonos inteligentes o tablets. Para hacer esto: Levante el telfono inteligente o la tablet para Education officer, community de inclinar la cabeza o el cuello para mirar hacia abajo. Coloque el telfono inteligente o la tablet al nivel de su cara mientras mira la pantalla. Siga el plan de tratamiento como se lo haya indicado el mdico. Esto puede incluir: Terapia cognitiva o conductual. Acupuntura o terapia de masajes. Yoga o meditacin. Comunquese con un mdico si: Siente un dolor que no se alivia con reposo o medicamentos. Siente mucho dolor que se extiende a las piernas o las nalgas. El dolor no mejora luego de 2 semanas. Siente dolor por la noche. Pierde peso sin proponrselo. Tiene fiebre o escalofros. Siente nuseas o vmitos. Siente dolor abdominal. Solicite ayuda de inmediato si: Tiene nuevos problemas para controlar la vejiga o los intestinos. Siente debilidad o adormecimiento inusuales en los brazos o en las piernas. Siente que va a desmayarse. Estos sntomas pueden representar un problema grave que constituye Radio broadcast assistant. No espere a ver si los sntomas desaparecen. Solicite atencin mdica de inmediato. Comunquese con el servicio de emergencias de su localidad (911 en los [place]). No conduzca por sus propios medios [employer]. Resumen El dolor de espalda agudo es repentino y por lo general no dura mucho tiempo. Use tcnicas apropiadas para levantar objetos. Cuando se inclina y levanta un  [place], utilice posiciones que no sobrecarguen tanto la espalda. Tome los medicamentos de venta libre y los recetados solamente como se lo haya indicado el mdico, y aplquese calor o hielo segn las indicaciones. Esta informacin no tiene Theme park manager el consejo del mdico. Asegrese de hacerle al mdico cualquier pregunta que tenga. Document Revised: 12/22/2020 Document Reviewed: 12/22/2020 Elsevier Patient Education  2022 [employer].

## 2021-06-13 NOTE — Progress Notes (Signed)
[facility]    HPI 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 Actually pain is right side lower back- started a month ago.Started all of a sudden and gradually getting worst. Pain level 8/10 . Naproxen eased the pain. He has to lift heavy items at work 54-60 lbs and twist to place items. He did feel a pop a month ago ignored it but the pain started gradually increasing. Denies urinary or fecal problems. His main concern is numbness in the leg right side that radiates to thigh.   Past Medical History:  Diagnosis Date   Avulsion fracture of lateral malleolus of right fibula 06/09/2020   Diabetes ([place]) 06/09/2020   Fall 06/07/2020   FALL FROM LADDER    Open wound of knee, complicated 03/04/8371     No Known Allergies    Current Outpatient Medications on File Prior to Visit  Medication Sig Dispense Refill   Cholecalciferol (VITAMIN D) 125 MCG (5000 UT) CAPS Take 1 capsule by mouth daily. 30 capsule 6   docusate sodium (COLACE) 100 MG capsule Take 1 capsule (100 mg total) by mouth 2 (two) times daily. 90 capsule 1   enoxaparin (LOVENOX) 40 MG/0.4ML injection Inject 0.4 mLs (40 mg total) into the skin daily for 14 days. 5.6 mL 0   metFORMIN (GLUCOPHAGE-XR) 500 MG 24 hr tablet TAKE 1 TABLET BY MOUTH DAILY WITH BREAKFAST. (Patient not taking: Reported on 06/13/2021) 90 tablet 0   polyethylene glycol (MIRALAX / GLYCOLAX) 17 g packet Take 17 g by mouth daily. 14 each 0   No current facility-administered medications on file prior to visit.    ROS: all negative except above.   Physical Exam: Filed Weights   06/13/21 1042  Weight: 271 lb (122.9 kg)   BP (!) 156/91 (BP Location: Right Arm, Patient Position: Sitting, Cuff Size: Large)   Pulse 67   Temp (!) 97.5 F (36.4 C) (Temporal)   Ht _0  (1.803 m)   Wt 271 lb (122.9 kg)   SpO2 97%   BMI 37.80 kg/m  General Appearance: Well  nourished, in no apparent distress. Eyes: PERRLA, EOMs, conjunctiva no swelling or erythema Sinuses: No Frontal/maxillary tenderness ENT/Mouth: Ext aud canals clear, TMs without erythema, bulging. No erythema, swelling, or exudate on post pharynx.  Tonsils not swollen or erythematous. Hearing normal.  Neck: Supple, thyroid normal.  Respiratory: Respiratory effort normal, BS equal bilaterally without rales, rhonchi, wheezing or stridor.  Cardio: RRR with no MRGs. Brisk peripheral pulses without edema.  Abdomen: Soft, + BS.  Non tender, no guarding, rebound, hernias, masses. Lymphatics: Non tender without lymphadenopathy.  Musculoskeletal: right lower back swollen and warm to touch.  Skin: Warm, dry without rashes, lesions, ecchymosis.  Neuro: Normal muscle tone,  Sensation intact.  Psych: Awake and oriented X 3, normal affect, Insight and Judgment appropriate.    Kyjuan was seen today for flank pain and numbness.  Diagnoses and all orders for this visit:  Type 2 diabetes mellitus without complication, without long-term current use of insulin (HCC) -     HgB A1c 7.2  Monitor foods that are high in carbohydrates are the following rice, potatoes, tortillas, breads, sugars, and pastas.  Reduction in the intake (eating) will assist in lowering your blood sugars.  -     metFORMIN (GLUCOPHAGE-XR) 500 MG 24 hr tablet; TAKE 1 TABLET BY MOUTH DAILY WITH BREAKFAST. -  glipiZIDE (GLUCOTROL) 10 MG tablet; Take 1 tablet (10 mg total) by mouth 2 (two) times daily before a meal. -     Lipid Panel -     CBC with Differential  Flank pain -     POCT URINALYSIS DIP (CLINITEK)  The area that is swollen is initiation or point of numbness that radiates down his right leg.  Right side swollen, warm and tender- no pain.  Explained can be numbness related to diabetes , diabetic neuropathy if continues.  We will consider gabapentin. Advise to fill out financial assistance therefore he could be referred out to  specialists  Essential hypertension Counseled on blood pressure goal of less than 130/80, low-sodium, DASH diet, medication compliance, 150 minutes of moderate intensity exercise per week. Discussed medication compliance, adverse effects.  Reviewed foods that are high in sodium -     lisinopril (ZESTRIL) 20 MG tablet; Take 1 tablet (20 mg total) by mouth daily. -     hydrochlorothiazide (HYDRODIURIL) 25 MG tablet; Take 1 tablet (25 mg total) by mouth daily. -     CMP14+EGFR  Class 2 obesity due to excess calories without serious comorbidity with body mass index (BMI) of 37.0 to 37.9 in adult Obesity is 30-39 indicating an excess in caloric intake or underlining conditions. This may lead to other co-morbidities. Lifestyle modifications of diet and exercise may reduce obesity.    Tobacco dependence - I have recommended complete cessation of tobacco use. I have discussed various options available for assistance with tobacco cessation including over the counter methods (Nicotine gum, patch and lozenges). We also discussed prescription options (Chantix, Nicotine Inhaler / Nasal Spray). The patient is not interested in pursuing any prescription tobacco cessation options at this time. - Patient declines at this time.  - Less than 5 minutes spent on counseling.    [name], NP 10:53 AM

## 2021-06-14 LAB — CBC WITH DIFFERENTIAL/PLATELET
Basophils Absolute: 0 10*3/uL (ref 0.0–0.2)
Basos: 0 %
EOS (ABSOLUTE): 0.1 10*3/uL (ref 0.0–0.4)
Eos: 1 %
Hematocrit: 46 % (ref 37.5–51.0)
Hemoglobin: 15.3 g/dL (ref 13.0–17.7)
Immature Grans (Abs): 0 10*3/uL (ref 0.0–0.1)
Immature Granulocytes: 0 %
Lymphocytes Absolute: 1.3 10*3/uL (ref 0.7–3.1)
Lymphs: 23 %
MCH: 28.6 pg (ref 26.6–33.0)
MCHC: 33.3 g/dL (ref 31.5–35.7)
MCV: 86 fL (ref 79–97)
Monocytes Absolute: 0.3 10*3/uL (ref 0.1–0.9)
Monocytes: 6 %
Neutrophils Absolute: 3.8 10*3/uL (ref 1.4–7.0)
Neutrophils: 70 %
Platelets: 207 10*3/uL (ref 150–450)
RBC: 5.35 x10E6/uL (ref 4.14–5.80)
RDW: 11.7 % (ref 11.6–15.4)
WBC: 5.4 10*3/uL (ref 3.4–10.8)

## 2021-06-14 LAB — LIPID PANEL
Chol/HDL Ratio: 6.1 ratio — ABNORMAL HIGH (ref 0.0–5.0)
Cholesterol, Total: 237 mg/dL — ABNORMAL HIGH (ref 100–199)
HDL: 39 mg/dL — ABNORMAL LOW (ref >39)
LDL Chol Calc (NIH): 175 mg/dL — ABNORMAL HIGH (ref 0–99)
Triglycerides: 124 mg/dL (ref 0–149)
VLDL Cholesterol Cal: 23 mg/dL (ref 5–40)

## 2021-06-14 LAB — CMP14+EGFR
ALT: 47 IU/L — ABNORMAL HIGH (ref 0–44)
AST: 31 IU/L (ref 0–40)
Albumin/Globulin Ratio: 1.6 (ref 1.2–2.2)
Albumin: 4.5 g/dL (ref 4.0–5.0)
Alkaline Phosphatase: 75 IU/L (ref 44–121)
BUN/Creatinine Ratio: 20 (ref 9–20)
BUN: 12 mg/dL (ref 6–24)
Bilirubin Total: 0.4 mg/dL (ref 0.0–1.2)
CO2: 25 mmol/L (ref 20–29)
Calcium: 10 mg/dL (ref 8.7–10.2)
Chloride: 100 mmol/L (ref 96–106)
Creatinine, Ser: 0.6 mg/dL — ABNORMAL LOW (ref 0.76–1.27)
Globulin, Total: 2.8 g/dL (ref 1.5–4.5)
Glucose: 184 mg/dL — ABNORMAL HIGH (ref 70–99)
Potassium: 4.8 mmol/L (ref 3.5–5.2)
Sodium: 140 mmol/L (ref 134–144)
Total Protein: 7.3 g/dL (ref 6.0–8.5)
eGFR: 119 mL/min/{1.73_m2} (ref >59)

## 2021-06-15 ENCOUNTER — Other Ambulatory Visit (INDEPENDENT_AMBULATORY_CARE_PROVIDER_SITE_OTHER): Payer: Self-pay | Admitting: Primary Care

## 2021-06-15 DIAGNOSIS — E782 Mixed hyperlipidemia: Secondary | ICD-10-CM

## 2021-06-15 MED ORDER — PRAVASTATIN SODIUM 80 MG PO TABS: 80.0000 mg | ORAL_TABLET | Freq: Every day | ORAL | 3 refills | Status: DC

## 2021-06-18 ENCOUNTER — Encounter (INDEPENDENT_AMBULATORY_CARE_PROVIDER_SITE_OTHER): Payer: Self-pay | Admitting: Primary Care

## 2021-06-24 ENCOUNTER — Telehealth: Payer: Self-pay | Admitting: Surgery

## 2021-07-11 ENCOUNTER — Ambulatory Visit (INDEPENDENT_AMBULATORY_CARE_PROVIDER_SITE_OTHER): Payer: Self-pay | Admitting: Primary Care

## 2022-06-06 NOTE — Telephone Encounter (Signed)
Error

## 2023-06-07 ENCOUNTER — Ambulatory Visit (INDEPENDENT_AMBULATORY_CARE_PROVIDER_SITE_OTHER): Payer: Self-pay | Admitting: Primary Care

## 2023-06-14 ENCOUNTER — Ambulatory Visit (INDEPENDENT_AMBULATORY_CARE_PROVIDER_SITE_OTHER): Payer: Self-pay | Admitting: Primary Care

## 2023-06-14 ENCOUNTER — Ambulatory Visit (INDEPENDENT_AMBULATORY_CARE_PROVIDER_SITE_OTHER): Payer: Self-pay | Admitting: Nurse Practitioner

## 2023-06-14 ENCOUNTER — Encounter: Payer: Self-pay | Admitting: Nurse Practitioner

## 2023-06-14 VITALS — BP 139/77 | HR 93 | Ht 71.0 in

## 2023-06-14 DIAGNOSIS — E119 Type 2 diabetes mellitus without complications: Secondary | ICD-10-CM

## 2023-06-14 DIAGNOSIS — E782 Mixed hyperlipidemia: Secondary | ICD-10-CM

## 2023-06-14 DIAGNOSIS — S82042B Displaced comminuted fracture of left patella, initial encounter for open fracture type I or II: Secondary | ICD-10-CM

## 2023-06-14 DIAGNOSIS — I10 Essential (primary) hypertension: Secondary | ICD-10-CM

## 2023-06-14 LAB — POCT GLYCOSYLATED HEMOGLOBIN (HGB A1C): HbA1c, POC (controlled diabetic range): 9.7 % — AB (ref 0.0–7.0)

## 2023-06-14 MED ORDER — METFORMIN HCL ER 500 MG PO TB24
500.0000 mg | ORAL_TABLET | Freq: Two times a day (BID) | ORAL | 2 refills | Status: DC
Start: 2023-06-14 — End: 2023-10-26

## 2023-06-14 MED ORDER — METFORMIN HCL ER 500 MG PO TB24
ORAL_TABLET | Freq: Every day | ORAL | 0 refills | Status: DC
Start: 2023-06-14 — End: 2023-06-14

## 2023-06-14 MED ORDER — PRAVASTATIN SODIUM 80 MG PO TABS: 80.0000 mg | ORAL_TABLET | Freq: Every day | ORAL | 3 refills | Status: AC

## 2023-06-14 NOTE — Patient Instructions (Addendum)
1. Type 2 diabetes mellitus without complication, without long-term current use of insulin (HCC)  - POCT glycosylated hemoglobin (Hb A1C) - CBC - Comprehensive metabolic panel - metFORMIN (GLUCOPHAGE-XR) 500 MG 24 hr tablet; Take 1 tablet (500 mg total) by mouth 2 (two) times daily with a meal.  Dispense: 60 tablet; Refill: 2  2. Mixed hyperlipidemia  - pravastatin (PRAVACHOL) 80 MG tablet; Take 1 tablet (80 mg total) by mouth daily.  Dispense: 90 tablet; Refill: 3  3. Essential hypertension  Blood pressure stable in office today - will continue to monitor  4. Open comminuted fracture of left patella  - AMB referral to orthopedics    Follow up:  Follow up in 3 months

## 2023-06-14 NOTE — Progress Notes (Signed)
Subjective   Patient ID: Rodney Pittman, male    DOB: 29-Mar-1971, 52 y.o.   MRN: [id number]  Chief Complaint  Patient presents with   Follow-up    DM f/u & BP check. Med refills.  Requesting referral for leg      Referring provider: [name], NP  [name] is a 52 y.o. male with Past Medical History: 06/09/2020: Avulsion fracture of lateral malleolus of right fibula 06/09/2020: Diabetes (HCC) 06/07/2020: Fall     Comment:  FALL FROM LADDER  06/09/2020: Open wound of knee, complicated   HPI  Patient presents today to establish care. He has been lost to follow up since 2022 when he was last seen at [facility] and [facility]. He has not been on any medications. Blood pressure was good in the office today. A1C was 9.7. We start patient back on metformin. We discussed diabetic diet with interpreter. Patient does have a history of fracture to left patella several years ago.  Patient states that he has been having a lot of pain to that left knee and lower extremity.  He is asking for referral back to orthopedics for further evaluation and treatment.  Will place referral today.  Denies f/c/s, n/v/d, hemoptysis, PND, leg swelling Denies chest pain or edema         No Known Allergies  Immunization History  Administered Date(s) Administered   Tdap 02/25/2017    Tobacco History: Social History   Tobacco Use  Smoking Status Every Day   Current packs/day: 0.15   Types: Cigarettes  Smokeless Tobacco Never  Tobacco Comments   3 PER DAY   Ready to quit: Not Answered Counseling given: Not Answered Tobacco comments: 3 PER DAY   Outpatient Encounter Medications as of 06/14/2023  Medication Sig   metFORMIN (GLUCOPHAGE-XR) 500 MG 24 hr tablet Take 1 tablet (500 mg total) by mouth 2 (two) times daily with a meal.   Cholecalciferol (VITAMIN D) 125 MCG (5000 UT) CAPS Take 1 capsule by mouth daily. (Patient not taking: Reported on  06/14/2023)   docusate sodium (COLACE) 100 MG capsule Take 1 capsule (100 mg total) by mouth 2 (two) times daily. (Patient not taking: Reported on 06/14/2023)   enoxaparin (LOVENOX) 40 MG/0.4ML injection Inject 0.4 mLs (40 mg total) into the skin daily for 14 days.   glipiZIDE (GLUCOTROL) 10 MG tablet Take 1 tablet (10 mg total) by mouth 2 (two) times daily before a meal. (Patient not taking: Reported on 06/14/2023)   hydrochlorothiazide (HYDRODIURIL) 25 MG tablet Take 1 tablet (25 mg total) by mouth daily. (Patient not taking: Reported on 06/14/2023)   lisinopril (ZESTRIL) 20 MG tablet Take 1 tablet (20 mg total) by mouth daily. (Patient not taking: Reported on 06/14/2023)   polyethylene glycol (MIRALAX / GLYCOLAX) 17 g packet Take 17 g by mouth daily. (Patient not taking: Reported on 06/14/2023)   pravastatin (PRAVACHOL) 80 MG tablet Take 1 tablet (80 mg total) by mouth daily.   [DISCONTINUED] metFORMIN (GLUCOPHAGE-XR) 500 MG 24 hr tablet TAKE 1 TABLET BY MOUTH DAILY WITH BREAKFAST.   [DISCONTINUED] metFORMIN (GLUCOPHAGE-XR) 500 MG 24 hr tablet TAKE 1 TABLET BY MOUTH DAILY WITH BREAKFAST.   [DISCONTINUED] pravastatin (PRAVACHOL) 80 MG tablet Take 1 tablet (80 mg total) by mouth daily. (Patient not taking: Reported on 06/14/2023)   No facility-administered encounter medications on file as of 06/14/2023.    Review of Systems  Review of Systems  Constitutional: Negative.   HENT: Negative.  Cardiovascular: Negative.   Gastrointestinal: Negative.   Allergic/Immunologic: Negative.   Neurological: Negative.   Psychiatric/Behavioral: Negative.       Objective:   BP 139/77 (BP Location: Left Arm, Patient Position: Sitting, Cuff Size: Large)   Pulse 93   Ht 5\' 11"  (1.803 m)   SpO2 98%   BMI 37.80 kg/m   Wt Readings from Last 5 Encounters:  06/13/21 271 lb (122.9 kg)  06/02/21 270 lb 3.2 oz (122.6 kg)  05/17/19 254 lb 9.6 oz (115.5 kg)  08/16/18 190 lb (86.2 kg)  10/12/16 180 lb (81.6 kg)      Physical Exam Vitals and nursing note reviewed.  Constitutional:      General: He is not in acute distress.    Appearance: He is well-developed.  Cardiovascular:     Rate and Rhythm: Normal rate and regular rhythm.  Pulmonary:     Effort: Pulmonary effort is normal.     Breath sounds: Normal breath sounds.  Skin:    General: Skin is warm and dry.  Neurological:     Mental Status: He is alert and oriented to person, place, and time.       Assessment & Plan:   Type 2 diabetes mellitus without complication, without long-term current use of insulin (HCC) -     POCT glycosylated hemoglobin (Hb A1C) -     CBC -     Comprehensive metabolic panel -     metFORMIN HCl ER; Take 1 tablet (500 mg total) by mouth 2 (two) times daily with a meal.  Dispense: 60 tablet; Refill: 2  Mixed hyperlipidemia -     Pravastatin Sodium; Take 1 tablet (80 mg total) by mouth daily.  Dispense: 90 tablet; Refill: 3  Essential hypertension  Open comminuted fracture of left patella -     Ambulatory referral to Orthopedics    Patient Instructions  1. Type 2 diabetes mellitus without complication, without long-term current use of insulin (HCC)  - POCT glycosylated hemoglobin (Hb A1C) - CBC - Comprehensive metabolic panel - metFORMIN (GLUCOPHAGE-XR) 500 MG 24 hr tablet; Take 1 tablet (500 mg total) by mouth 2 (two) times daily with a meal.  Dispense: 60 tablet; Refill: 2  2. Mixed hyperlipidemia  - pravastatin (PRAVACHOL) 80 MG tablet; Take 1 tablet (80 mg total) by mouth daily.  Dispense: 90 tablet; Refill: 3  3. Essential hypertension  Blood pressure stable in office today - will continue to monitor  4. Open comminuted fracture of left patella  - AMB referral to orthopedics    Follow up:  Follow up in 3 months      Return in about 3 months (around 09/13/2023).   Ivonne Andrew, NP 06/14/2023

## 2023-06-16 LAB — CBC
Hematocrit: 47.6 % (ref 37.5–51.0)
Hemoglobin: 15 g/dL (ref 13.0–17.7)
MCH: 28.1 pg (ref 26.6–33.0)
MCHC: 31.5 g/dL (ref 31.5–35.7)
MCV: 89 fL (ref 79–97)
Platelets: 186 10*3/uL (ref 150–450)
RBC: 5.34 x10E6/uL (ref 4.14–5.80)
RDW: 11.7 % (ref 11.6–15.4)
WBC: 3.5 10*3/uL (ref 3.4–10.8)

## 2023-06-16 LAB — COMPREHENSIVE METABOLIC PANEL
ALT: 54 [IU]/L — ABNORMAL HIGH (ref 0–44)
AST: 38 [IU]/L (ref 0–40)
Albumin: 4.2 g/dL (ref 4.1–5.1)
Alkaline Phosphatase: 79 [IU]/L (ref 44–121)
BUN/Creatinine Ratio: 14 (ref 9–20)
BUN: 11 mg/dL (ref 6–24)
Bilirubin Total: 0.3 mg/dL (ref 0.0–1.2)
CO2: 23 mmol/L (ref 20–29)
Calcium: 9.4 mg/dL (ref 8.7–10.2)
Chloride: 98 mmol/L (ref 96–106)
Creatinine, Ser: 0.78 mg/dL (ref 0.76–1.27)
Globulin, Total: 2.8 g/dL (ref 1.5–4.5)
Glucose: 337 mg/dL — ABNORMAL HIGH (ref 70–99)
Potassium: 4.5 mmol/L (ref 3.5–5.2)
Sodium: 138 mmol/L (ref 134–144)
Total Protein: 7 g/dL (ref 6.0–8.5)
eGFR: 109 mL/min/{1.73_m2} (ref >59)

## 2023-06-19 ENCOUNTER — Ambulatory Visit (INDEPENDENT_AMBULATORY_CARE_PROVIDER_SITE_OTHER): Payer: Self-pay | Admitting: Primary Care

## 2023-06-21 ENCOUNTER — Ambulatory Visit (INDEPENDENT_AMBULATORY_CARE_PROVIDER_SITE_OTHER): Payer: Self-pay | Admitting: Physician Assistant

## 2023-06-21 ENCOUNTER — Other Ambulatory Visit (INDEPENDENT_AMBULATORY_CARE_PROVIDER_SITE_OTHER): Payer: Self-pay

## 2023-06-21 ENCOUNTER — Encounter: Payer: Self-pay | Admitting: Physician Assistant

## 2023-06-21 DIAGNOSIS — S82042B Displaced comminuted fracture of left patella, initial encounter for open fracture type I or II: Secondary | ICD-10-CM

## 2023-06-21 DIAGNOSIS — M12562 Traumatic arthropathy, left knee: Secondary | ICD-10-CM | POA: Insufficient documentation

## 2023-06-21 NOTE — Progress Notes (Signed)
Office Visit Note   Patient: Rodney Pittman           Date of Birth: 15-Mar-1971           MRN: [id number] Visit Date: 06/21/2023              Requested by: Ivonne Andrew, NP [phone] [address] [place],  [place] [address] PCP: [name], NP   Assessment & Plan: Visit Diagnoses:  1. Open comminuted fracture of left patella   2. Traumatic arthritis of left knee     Plan: Patient is a 52 year old gentleman with a 3-year history of left anterior knee pain.  He has a history of an open fracture of the left patella which was treated by Dr. Carola Frost in 2021.  He comes in today complaining of both stiffness and pain in the anterior aspect of his knee.  His quadriceps and patella tension function is fine he is got some mild soft tissue swelling but no evidence of any effusion or erythema or infection.  He was last seen by Fayrene Fearing a couple years ago who recommended physical therapy and then evaluation with Dr. August Saucer to see if any surgical options were available.  He has not had any new injury and from what I can see did not attend physical therapy.  I recommended physical therapy for him.  He can follow-up with me in 6 weeks.  Unfortunately I do not think surgical intervention would be appropriate at this time because he does have a hemoglobin A1c of 9.7.  He understands that this is important to get control of.  He also notices he has more varicosities on the left side given the nature of this injury not surprising.  No evidence of DVT I recommended a compression stocking  Follow-Up Instructions: Return in about 6 weeks (around 08/02/2023).   Orders:  Orders Placed This Encounter  Procedures   XR Knee 1-2 Views Left   Ambulatory referral to Physical Therapy   No orders of the defined types were placed in this encounter.     Procedures: No procedures performed   Clinical Data: No additional findings.   Subjective: Chief Complaint  Patient presents with   Left Knee  - Pain    HPI patient is seen with the help of interpretive video today.  He is a 52 year old gentleman with a 3-year history of anterior knee pain.  He fell off a 22 foot ladder and sustained an open left patella fracture which was fixed via open reduction internal fixation by Dr. Carola Frost.  Denies any recent injury.  Has been seen in the past for anterior knee pain and stiffness.  Was referred to physical therapy but for some reason did not go  Review of Systems  All other systems reviewed and are negative.    Objective: Vital Signs: There were no vitals taken for this visit.  Physical Exam Constitutional:      Appearance: Normal appearance.  Pulmonary:     Effort: Pulmonary effort is normal.  Skin:    General: Skin is warm and dry.  Neurological:     General: No focal deficit present.     Mental Status: He is alert.  Psychiatric:        Mood and Affect: Mood normal.        Behavior: Behavior normal.     Ortho Exam Examination of his left knee well-healed laceration incision.  Compartments are soft and nontender neurovascularly intact he does  have quite a few varicosities on his lower leg but no evidence of DVT negative Homans' sign.  He has full extension of his knee can actively engage his quadricep and patella tendon.  Flexes to just shy of 90 degrees Specialty Comments:  No specialty comments available.  Imaging: XR Knee 1-2 Views Left  Result Date: 06/21/2023 Radiographs of his left knee demonstrate previous ORIF of the patella.  Does not appear to be any migration of the 2 screws.  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    PMFS History: Patient Active Problem List   Diagnosis Date Noted   Traumatic arthritis of left knee 06/21/2023   Newly diagnosed diabetes (HCC) 06/09/2020   Avulsion fracture of lateral malleolus of right fibula 06/09/2020   Open wound of knee, complicated 06/09/2020   Open comminuted fracture of left patella 06/08/2020    Nicotine dependence 06/08/2020   Fall from height of greater than 3 feet 06/07/2020   Past Medical History:  Diagnosis Date   Avulsion fracture of lateral malleolus of right fibula 06/09/2020   Diabetes (HCC) 06/09/2020   Fall 06/07/2020   FALL FROM LADDER    Open wound of knee, complicated 06/09/2020    Family History  Problem Relation Age of Onset   Diabetes Mother     Past Surgical History:  Procedure Laterality Date   APPENDECTOMY     APPLICATION OF WOUND VAC Left 06/07/2020   APPLICATION OF WOUND VAC (Left Knee)   APPLICATION OF WOUND VAC Left 06/07/2020   Procedure: APPLICATION OF WOUND VAC;  Surgeon: [name], MD;  Location: [facility] OR;  Service: Orthopedics;  Laterality: Left;   I & D EXTREMITY Left 06/07/2020   Procedure: IRRIGATION AND DEBRIDEMENT KNEE with REPAIR;  Surgeon: [name], MD;  Location: [facility] OR;  Service: Orthopedics;  Laterality: Left;   IRRIGATION AND DEBRIDEMENT KNEE Left 06/07/2020   IRRIGATION AND DEBRIDEMENT KNEE POSSIBLE REPAIR (Left Knee)   ORIF PATELLA Left 06/07/2020   OPEN REDUCTION INTERNAL (ORIF) FIXATION PATELLA (Knee)   ORIF PATELLA  06/07/2020   Procedure: OPEN REDUCTION INTERNAL (ORIF) FIXATION PATELLA;  Surgeon: [name], MD;  Location: [facility] OR;  Service: Orthopedics;;   Social History   Occupational History   Not on file  Tobacco Use   Smoking status: Every Day    Current packs/day: 0.15    Types: Cigarettes   Smokeless tobacco: Never   Tobacco comments:    3 PER DAY  Vaping Use   Vaping status: Never Used  Substance and Sexual Activity   Alcohol use: Yes    Comment: [facility]   Drug use: Never   Sexual activity: Not on file

## 2023-07-09 ENCOUNTER — Other Ambulatory Visit: Payer: Self-pay

## 2023-07-09 ENCOUNTER — Ambulatory Visit (INDEPENDENT_AMBULATORY_CARE_PROVIDER_SITE_OTHER): Payer: Self-pay | Admitting: Physical Therapy

## 2023-07-09 DIAGNOSIS — M6281 Muscle weakness (generalized): Secondary | ICD-10-CM

## 2023-07-09 DIAGNOSIS — S82042B Displaced comminuted fracture of left patella, initial encounter for open fracture type I or II: Secondary | ICD-10-CM

## 2023-07-09 DIAGNOSIS — M25562 Pain in left knee: Secondary | ICD-10-CM

## 2023-07-09 DIAGNOSIS — R2689 Other abnormalities of gait and mobility: Secondary | ICD-10-CM

## 2023-07-09 NOTE — Therapy (Signed)
OUTPATIENT PHYSICAL THERAPY LOWER EXTREMITY EVALUATION   Patient Name: Rodney Pittman MRN: [id number] DOB:Jul 29, 1971, 52 y.o., male Today's Date: 07/09/2023  END OF SESSION:  PT End of Session - 07/09/23 1428     Visit Number 1    Number of Visits 6    Date for PT Re-Evaluation 08/20/23    Authorization Type Self Pay    PT Start Time 1430    PT Stop Time 1515    PT Time Calculation (min) 45 min    Activity Tolerance Patient tolerated treatment well    Behavior During Therapy [facility] for tasks assessed/performed             Past Medical History:  Diagnosis Date   Avulsion fracture of lateral malleolus of right fibula 06/09/2020   Diabetes (HCC) 06/09/2020   Fall 06/07/2020   FALL FROM LADDER    Open wound of knee, complicated 06/09/2020   Past Surgical History:  Procedure Laterality Date   APPENDECTOMY     APPLICATION OF WOUND VAC Left 06/07/2020   APPLICATION OF WOUND VAC (Left Knee)   APPLICATION OF WOUND VAC Left 06/07/2020   Procedure: APPLICATION OF WOUND VAC;  Surgeon: [name], MD;  Location: [facility] OR;  Service: Orthopedics;  Laterality: Left;   I & D EXTREMITY Left 06/07/2020   Procedure: IRRIGATION AND DEBRIDEMENT KNEE with REPAIR;  Surgeon: [name], MD;  Location: [facility] OR;  Service: Orthopedics;  Laterality: Left;   IRRIGATION AND DEBRIDEMENT KNEE Left 06/07/2020   IRRIGATION AND DEBRIDEMENT KNEE POSSIBLE REPAIR (Left Knee)   ORIF PATELLA Left 06/07/2020   OPEN REDUCTION INTERNAL (ORIF) FIXATION PATELLA (Knee)   ORIF PATELLA  06/07/2020   Procedure: OPEN REDUCTION INTERNAL (ORIF) FIXATION PATELLA;  Surgeon: [name], MD;  [facility];  Service: Orthopedics;;   Patient Active Problem List   Diagnosis Date Noted   Traumatic arthritis of left knee 06/21/2023   Newly diagnosed diabetes (HCC) 06/09/2020   Avulsion fracture of lateral malleolus of right fibula 06/09/2020   Open wound of knee, complicated 06/09/2020   Open comminuted  fracture of left patella 06/08/2020   Nicotine dependence 06/08/2020   Fall from height of greater than 3 feet 06/07/2020    PCP: [name], NP  REFERRING PROVIDER: Persons, [place], [place]  REFERRING DIAG:  [id number] (ICD-10-CM) - Open comminuted fracture of left patella    THERAPY DIAG:  Acute pain of left knee  Open comminuted fracture of left patella  Muscle weakness (generalized)  Other abnormalities of gait and mobility  Rationale for Evaluation and Treatment: Rehabilitation  ONSET DATE: 3-4 months  SUBJECTIVE:   SUBJECTIVE STATEMENT: Spanish interpretor present to assist. Pt states he wants to get treatment for his left leg. Pt states he doesn't know what happened (no method of injury) but L LE has been hurting him more for ~3-4 months. Pt reports he can't bend it but so far. Has not been able to bend his knee since his surgery ~ 3 years ago after an accident. He reports he did not get any PT after his inury. Pt does have a knee brace but it bothers him to wear it. Pt has a fear of his knee buckling or going out of place.   PERTINENT HISTORY: From H&P: 52 year old gentleman with a 3-year history of anterior knee pain. He fell off a 22 foot ladder and sustained an open left patella fracture which was fixed via open reduction internal fixation by Dr. Carola Frost  PAIN:  Are you having pain? Yes: NPRS scale: 0 at rest, at worst 6 /10 Pain location: Medial L knee Pain description: sharp at times, muscle pain, feels like it's in the bone Aggravating factors: walking or standing (>1 hour), activities involving his leg Relieving factors: nothing, wears knee brace that massages and heats up  PRECAUTIONS: None  RED FLAGS: None   WEIGHT BEARING RESTRICTIONS: No  FALLS:  Has patient fallen in last 6 months? No  LIVING ENVIRONMENT: Lives with: lives with their family Lives in: House/apartment Stairs: 3 steps -- has to go up one step at a time Has following equipment  at home: None  OCCUPATION: Makes signs  PLOF: Independent  PATIENT GOALS: Go up/down stairs with more stability, walk more  NEXT MD VISIT: n/a  OBJECTIVE:  Note: Objective measures were completed at Evaluation unless otherwise noted.  DIAGNOSTIC FINDINGS: 06/21/23 Radiographs of his left knee demonstrate previous ORIF of the patella.   Does not appear to be any migration of the 2 screws.  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   PATIENT SURVEYS:  FOTO n/a - Spanish speaking LEFS = 56 / 80 = 70.0 %  COGNITION: Overall cognitive status: Within functional limits for tasks assessed     SENSATION: WFL -- some tingling when he taps his knee  EDEMA:  None  MUSCLE LENGTH: Hamstrings: WNL  POSTURE: No Significant postural limitations  PALPATION: TTP medial patella and tibiofemoral joint, mild tenderness in adductors  LOWER EXTREMITY ROM:  Active ROM Right eval Left eval  Hip flexion    Hip extension    Hip abduction    Hip adduction    Hip internal rotation    Hip external rotation    Knee flexion 118 79  Knee extension 0 -5  Ankle dorsiflexion    Ankle plantarflexion    Ankle inversion    Ankle eversion     (Blank rows = not tested)  LOWER EXTREMITY MMT:  MMT Right eval Left eval  Hip flexion 5 4  Hip extension 4- 3+  Hip abduction 5 3+  Hip adduction 5 3  Hip internal rotation 5 4-  Hip external rotation 5 3+  Knee flexion 5 4+  Knee extension 5 4  Ankle dorsiflexion    Ankle plantarflexion    Ankle inversion    Ankle eversion     (Blank rows = not tested)  LOWER EXTREMITY SPECIAL TESTS:  Did not assess  FUNCTIONAL TESTS:  SLS: TBA  GAIT: Distance walked: Into clinic Assistive device utilized: None Level of assistance: Complete Independence Comments: Independent   TODAY'S TREATMENT:                                                                                                                              DATE:  07/09/23 See HEP    PATIENT EDUCATION:  Education details: Exam findings, POC, initial HEP Person educated: Patient and interpretor Education method:  Explanation, Demonstration, and Handouts Education comprehension: verbalized understanding, returned demonstration, and needs further education  HOME EXERCISE PROGRAM: Access Code: [email or web address] URL: [email or web address] Date: 07/09/2023 Prepared by: Vernon Prey April Kirstie Peri  Exercises - Standing Hip Abduction with Bent Knee  - 1 x daily - 7 x weekly - 3 sets - 10 reps - 3 sec hold - Standing Hip Extension with Leg Bent and Support  - 1 x daily - 7 x weekly - 3 sets - 10 reps - 3 sec hold - Side Lunge Adductor Stretch  - 1 x daily - 7 x weekly - 3 sets - 10 reps - 3 sec hold - Seated Knee Flexion Stretch  - 1 x daily - 7 x weekly - 2 sets - 30 sec hold  ASSESSMENT:  CLINICAL IMPRESSION: Patient is a 52 y.o. M who was seen today for physical therapy evaluation and treatment for R knee pain. PMH significant for R knee injury/trauma ~3 years ago resulting in patellar ORIF. Assessment demos decreased knee ROM, R LE weakness with very decreased lateral and medial stability affecting balance, transfers, and gait limiting community mobility and tasks. Pt will greatly benefit from PT to address these deficits.    OBJECTIVE IMPAIRMENTS: decreased activity tolerance, decreased balance, decreased endurance, decreased mobility, decreased ROM, decreased strength, hypomobility, increased fascial restrictions, impaired flexibility, improper body mechanics, and pain.   ACTIVITY LIMITATIONS: sitting, squatting, stairs, transfers, and locomotion level  PARTICIPATION LIMITATIONS: cleaning, community activity, and yard work  PERSONAL FACTORS: Fitness, Past/current experiences, and Time since onset of injury/illness/exacerbation are also affecting patient's functional outcome.   REHAB POTENTIAL: Good  CLINICAL DECISION MAKING:  Evolving/moderate complexity  EVALUATION COMPLEXITY: Moderate   GOALS: Goals reviewed with patient? Yes  SHORT TERM GOALS: Target date: 07/30/2023  Pt will be ind with initial HEP Baseline: Goal status: INITIAL  2.  Pt will demo at least 4/5 with all L LE MMT for improved knee stability Baseline:  Goal status: INITIAL  3.  PT will assess SLS and create goals accordingly Baseline:  Goal status: INITIAL  LONG TERM GOALS: Target date: 08/20/2023   Pt will be ind with management and progression of HEP Baseline:  Goal status: INITIAL  2.  Pt will demo improved knee flexion to at least 90 deg for transfers and stairs Baseline:  Goal status: INITIAL  3.  Pt will be able to perform stairs in reciprocal pattern to demo increasing stability and ROM Baseline:  Goal status: INITIAL  4.  Pt will report </=2/10 knee pain with all mobility Baseline:  Goal status: INITIAL  5.  Pt will have improved LEFS >/=80% to demo MCID Baseline:  Goal status: INITIAL     PLAN:  PT FREQUENCY: 1x/week  PT DURATION: 6 weeks  PLANNED INTERVENTIONS: 97164- PT Re-evaluation, 97110-Therapeutic exercises, 97530- Therapeutic activity, 97112- Neuromuscular re-education, 97535- Self Care, [id number]- Manual therapy, [id number]- Gait training, 97014- Electrical stimulation (unattended), [phone]- Ionotophoresis 4mg /ml Dexamethasone, Patient/Family education, Balance training, Stair training, Taping, Dry Needling, Joint mobilization, Cryotherapy, and Moist heat  PLAN FOR NEXT SESSION: Assess response to HEP. Stretch knee as able for increased range. Work on lateral/medial knee stability. 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   Kennya Schwenn April Ma L Bich Mchaney, PT 07/09/2023, 3:46 PM

## 2023-07-19 ENCOUNTER — Encounter: Payer: Self-pay | Admitting: Physical Therapy

## 2023-07-19 NOTE — Therapy (Deleted)
OUTPATIENT PHYSICAL THERAPY LOWER EXTREMITY EVALUATION   Patient Name: Rodney Pittman MRN: [id number] DOB:06-23-1971, 52 y.o., male Today's Date: 07/19/2023  END OF SESSION:    Past Medical History:  Diagnosis Date   Avulsion fracture of lateral malleolus of right fibula 06/09/2020   Diabetes (HCC) 06/09/2020   Fall 06/07/2020   FALL FROM LADDER    Open wound of knee, complicated 06/09/2020   Past Surgical History:  Procedure Laterality Date   APPENDECTOMY     APPLICATION OF WOUND VAC Left 06/07/2020   APPLICATION OF WOUND VAC (Left Knee)   APPLICATION OF WOUND VAC Left 06/07/2020   Procedure: APPLICATION OF WOUND VAC;  Surgeon: [name], MD;  Location: [facility] OR;  Service: Orthopedics;  Laterality: Left;   I & D EXTREMITY Left 06/07/2020   Procedure: IRRIGATION AND DEBRIDEMENT KNEE with REPAIR;  Surgeon: [name], MD;  Location: [facility] OR;  Service: Orthopedics;  Laterality: Left;   IRRIGATION AND DEBRIDEMENT KNEE Left 06/07/2020   IRRIGATION AND DEBRIDEMENT KNEE POSSIBLE REPAIR (Left Knee)   ORIF PATELLA Left 06/07/2020   OPEN REDUCTION INTERNAL (ORIF) FIXATION PATELLA (Knee)   ORIF PATELLA  06/07/2020   Procedure: OPEN REDUCTION INTERNAL (ORIF) FIXATION PATELLA;  Surgeon: [name], MD;  Location: [facility] OR;  Service: Orthopedics;;   Patient Active Problem List   Diagnosis Date Noted   Traumatic arthritis of left knee 06/21/2023   Newly diagnosed diabetes (HCC) 06/09/2020   Avulsion fracture of lateral malleolus of right fibula 06/09/2020   Open wound of knee, complicated 06/09/2020   Open comminuted fracture of left patella 06/08/2020   Nicotine dependence 06/08/2020   Fall from height of greater than 3 feet 06/07/2020    PCP: [name], NP  REFERRING PROVIDER: Persons, [place], [place]  REFERRING DIAG:  [phone] (ICD-10-CM) - Open comminuted fracture of left patella    THERAPY DIAG:  No diagnosis found.  Rationale for Evaluation and  Treatment: Rehabilitation  ONSET DATE: 3-4 months  SUBJECTIVE:   SUBJECTIVE STATEMENT: ***  From eval: Spanish interpretor present to assist. Pt states he wants to get treatment for his left leg. Pt states he doesn't know what happened (no method of injury) but L LE has been hurting him more for ~3-4 months. Pt reports he can't bend it but so far. Has not been able to bend his knee since his surgery ~ 3 years ago after an accident. He reports he did not get any PT after his inury. Pt does have a knee brace but it bothers him to wear it. Pt has a fear of his knee buckling or going out of place.   PERTINENT HISTORY: From H&P: 52 year old gentleman with a 3-year history of anterior knee pain. He fell off a 22 foot ladder and sustained an open left patella fracture which was fixed via open reduction internal fixation by Dr. Carola Frost  PAIN:  Are you having pain? Yes: NPRS scale: 0 at rest, at worst 6 /10 Pain location: Medial L knee Pain description: sharp at times, muscle pain, feels like it's in the bone Aggravating factors: walking or standing (>1 hour), activities involving his leg Relieving factors: nothing, wears knee brace that massages and heats up  PRECAUTIONS: None  RED FLAGS: None   WEIGHT BEARING RESTRICTIONS: No  FALLS:  Has patient fallen in last 6 months? No  LIVING ENVIRONMENT: Lives with: lives with their family Lives in: House/apartment Stairs: 3 steps -- has to go up one step at a time  Has following equipment at home: None  OCCUPATION: Makes signs  PLOF: Independent  PATIENT GOALS: Go up/down stairs with more stability, walk more  NEXT MD VISIT: n/a  OBJECTIVE:  Note: Objective measures were completed at Evaluation unless otherwise noted.  DIAGNOSTIC FINDINGS: 06/21/23 Radiographs of his left knee demonstrate previous ORIF of the patella.   Does not appear to be any migration of the 2 screws.  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    PATIENT SURVEYS:  FOTO n/a - Spanish speaking LEFS = 56 / 80 = 70.0 %  COGNITION: Overall cognitive status: Within functional limits for tasks assessed     SENSATION: WFL -- some tingling when he taps his knee  EDEMA:  None  MUSCLE LENGTH: Hamstrings: WNL  POSTURE: No Significant postural limitations  PALPATION: TTP medial patella and tibiofemoral joint, mild tenderness in adductors  LOWER EXTREMITY ROM:  Active ROM Right eval Left eval  Hip flexion    Hip extension    Hip abduction    Hip adduction    Hip internal rotation    Hip external rotation    Knee flexion 118 79  Knee extension 0 -5  Ankle dorsiflexion    Ankle plantarflexion    Ankle inversion    Ankle eversion     (Blank rows = not tested)  LOWER EXTREMITY MMT:  MMT Right eval Left eval  Hip flexion 5 4  Hip extension 4- 3+  Hip abduction 5 3+  Hip adduction 5 3  Hip internal rotation 5 4-  Hip external rotation 5 3+  Knee flexion 5 4+  Knee extension 5 4  Ankle dorsiflexion    Ankle plantarflexion    Ankle inversion    Ankle eversion     (Blank rows = not tested)  LOWER EXTREMITY SPECIAL TESTS:  Did not assess  FUNCTIONAL TESTS:  SLS: TBA  GAIT: Distance walked: Into clinic Assistive device utilized: None Level of assistance: Complete Independence Comments: Independent   TODAY'S TREATMENT:                                                                                                                              DATE:  07/19/23 ***  07/09/23 See HEP    PATIENT EDUCATION:  Education details: Exam findings, POC, initial HEP Person educated: Patient and interpretor Education method: Explanation, Demonstration, and Handouts Education comprehension: verbalized understanding, returned demonstration, and needs further education  HOME EXERCISE PROGRAM: Access Code: [email or web address] URL: [email or web address] Date: 07/09/2023 Prepared by: Vernon Prey April Kirstie Peri  Exercises - Standing Hip Abduction with Bent Knee  - 1 x daily - 7 x weekly - 3 sets - 10 reps - 3 sec hold - Standing Hip Extension with Leg Bent and Support  - 1 x daily - 7 x weekly - 3 sets - 10 reps - 3 sec hold - Side Lunge Adductor Stretch  - 1 x daily - 7  x weekly - 3 sets - 10 reps - 3 sec hold - Seated Knee Flexion Stretch  - 1 x daily - 7 x weekly - 2 sets - 30 sec hold  ASSESSMENT:  CLINICAL IMPRESSION: ***  From eval: Patient is a 52 y.o. M who was seen today for physical therapy evaluation and treatment for R knee pain. PMH significant for R knee injury/trauma ~3 years ago resulting in patellar ORIF. Assessment demos decreased knee ROM, R LE weakness with very decreased lateral and medial stability affecting balance, transfers, and gait limiting community mobility and tasks. Pt will greatly benefit from PT to address these deficits.    OBJECTIVE IMPAIRMENTS: decreased activity tolerance, decreased balance, decreased endurance, decreased mobility, decreased ROM, decreased strength, hypomobility, increased fascial restrictions, impaired flexibility, improper body mechanics, and pain.     GOALS: Goals reviewed with patient? Yes  SHORT TERM GOALS: Target date: 07/30/2023  Pt will be ind with initial HEP Baseline: Goal status: INITIAL  2.  Pt will demo at least 4/5 with all L LE MMT for improved knee stability Baseline:  Goal status: INITIAL  3.  PT will assess SLS and create goals accordingly Baseline:  Goal status: INITIAL  LONG TERM GOALS: Target date: 08/20/2023   Pt will be ind with management and progression of HEP Baseline:  Goal status: INITIAL  2.  Pt will demo improved knee flexion to at least 90 deg for transfers and stairs Baseline:  Goal status: INITIAL  3.  Pt will be able to perform stairs in reciprocal pattern to demo increasing stability and ROM Baseline:  Goal status: INITIAL  4.  Pt will report </=2/10 knee pain with all  mobility Baseline:  Goal status: INITIAL  5.  Pt will have improved LEFS >/=80% to demo MCID Baseline:  Goal status: INITIAL     PLAN:  PT FREQUENCY: 1x/week  PT DURATION: 6 weeks  PLANNED INTERVENTIONS: 97164- PT Re-evaluation, 97110-Therapeutic exercises, 97530- Therapeutic activity, 97112- Neuromuscular re-education, 97535- Self Care, 11914- Manual therapy, [id number]- Gait training, 97014- Electrical stimulation (unattended), [phone]- Ionotophoresis 4mg /ml Dexamethasone, Patient/Family education, Balance training, Stair training, Taping, Dry Needling, Joint mobilization, Cryotherapy, and Moist heat  PLAN FOR NEXT SESSION: Assess response to HEP. Stretch knee as able for increased range. Work on lateral/medial knee stability. 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   Vivion Romano April Ma L Dashayla Theissen, PT 07/19/2023, 7:55 AM

## 2023-09-13 ENCOUNTER — Ambulatory Visit: Payer: Self-pay | Admitting: Nurse Practitioner

## 2023-09-13 ENCOUNTER — Encounter: Payer: Self-pay | Admitting: Nurse Practitioner

## 2023-09-13 VITALS — BP 139/81 | HR 78 | Temp 97.5°F | Wt 258.4 lb

## 2023-09-13 DIAGNOSIS — Z23 Encounter for immunization: Secondary | ICD-10-CM

## 2023-09-13 DIAGNOSIS — Z13220 Encounter for screening for lipoid disorders: Secondary | ICD-10-CM

## 2023-09-13 DIAGNOSIS — E119 Type 2 diabetes mellitus without complications: Secondary | ICD-10-CM

## 2023-09-13 LAB — POCT GLYCOSYLATED HEMOGLOBIN (HGB A1C): Hemoglobin A1C: 8 % — AB (ref 4.0–5.6)

## 2023-09-13 NOTE — Progress Notes (Signed)
Subjective   Patient ID: Rodney Pittman, male    DOB: 1971/04/10, 52 y.o.   MRN: [id number]  Chief Complaint  Patient presents with   Diabetes    Referring provider: [name], NP  Rodney Pittman is a 52 y.o. male with Past Medical History: 06/09/2020: Avulsion fracture of lateral malleolus of right fibula 06/09/2020: Diabetes (HCC) 06/07/2020: Fall     Comment:  FALL FROM LADDER  06/09/2020: Open wound of knee, complicated   HPI  Patient presents today for follow up. Blood pressure was good in the office today. A1C was 8.0. We start patient back on metformin. We discussed diabetic diet with interpreter.  Patient is compliant with medications.  Denies f/c/s, n/v/d, hemoptysis, PND, leg swelling.    No Known Allergies  Immunization History  Administered Date(s) Administered   Tdap 02/25/2017    Tobacco History: Social History   Tobacco Use  Smoking Status Every Day   Current packs/day: 0.15   Types: Cigarettes  Smokeless Tobacco Never  Tobacco Comments   3 PER DAY   Ready to quit: Not Answered Counseling given: Not Answered Tobacco comments: 3 PER DAY   Outpatient Encounter Medications as of 09/13/2023  Medication Sig   metFORMIN (GLUCOPHAGE-XR) 500 MG 24 hr tablet Take 1 tablet (500 mg total) by mouth 2 (two) times daily with a meal.   pravastatin (PRAVACHOL) 80 MG tablet Take 1 tablet (80 mg total) by mouth daily.   Cholecalciferol (VITAMIN D) 125 MCG (5000 UT) CAPS Take 1 capsule by mouth daily. (Patient not taking: Reported on 09/13/2023)   docusate sodium (COLACE) 100 MG capsule Take 1 capsule (100 mg total) by mouth 2 (two) times daily. (Patient not taking: Reported on 09/13/2023)   enoxaparin (LOVENOX) 40 MG/0.4ML injection Inject 0.4 mLs (40 mg total) into the skin daily for 14 days. (Patient not taking: Reported on 09/13/2023)   glipiZIDE (GLUCOTROL) 10 MG tablet Take 1 tablet (10 mg total) by mouth 2 (two) times daily before  a meal. (Patient not taking: Reported on 09/13/2023)   hydrochlorothiazide (HYDRODIURIL) 25 MG tablet Take 1 tablet (25 mg total) by mouth daily. (Patient not taking: Reported on 09/13/2023)   lisinopril (ZESTRIL) 20 MG tablet Take 1 tablet (20 mg total) by mouth daily. (Patient not taking: Reported on 09/13/2023)   polyethylene glycol (MIRALAX / GLYCOLAX) 17 g packet Take 17 g by mouth daily. (Patient not taking: Reported on 09/13/2023)   No facility-administered encounter medications on file as of 09/13/2023.    Review of Systems  Review of Systems  Constitutional: Negative.   HENT: Negative.    Cardiovascular: Negative.   Gastrointestinal: Negative.   Allergic/Immunologic: Negative.   Neurological: Negative.   Psychiatric/Behavioral: Negative.       Objective:   BP 139/81   Pulse 78   Temp (!) 97.5 F (36.4 C)   Wt 258 lb 6.4 oz (117.2 kg)   SpO2 100%   BMI 36.04 kg/m   Wt Readings from Last 5 Encounters:  09/13/23 258 lb 6.4 oz (117.2 kg)  06/13/21 271 lb (122.9 kg)  06/02/21 270 lb 3.2 oz (122.6 kg)  05/17/19 254 lb 9.6 oz (115.5 kg)  08/16/18 190 lb (86.2 kg)     Physical Exam Vitals and nursing note reviewed.  Constitutional:      General: He is not in acute distress.    Appearance: He is well-developed.  Cardiovascular:     Rate and Rhythm: Normal rate and regular rhythm.  Pulmonary:     Effort: Pulmonary effort is normal.     Breath sounds: Normal breath sounds.  Skin:    General: Skin is warm and dry.  Neurological:     Mental Status: He is alert and oriented to person, place, and time.       Assessment & Plan:   Type 2 diabetes mellitus without complication, without long-term current use of insulin (HCC) -     POCT glycosylated hemoglobin (Hb A1C) -     Microalbumin / creatinine urine ratio -     CBC -     Comprehensive metabolic panel  Lipid screening -     Lipid panel  Need for influenza vaccination -     Flu vaccine trivalent PF,  6mos and older(Flulaval,Afluria,Fluarix,Fluzone)     Return in about 3 months (around 12/12/2023).   Ivonne Andrew, NP 09/13/2023

## 2023-09-13 NOTE — Patient Instructions (Addendum)
 1. Type 2 diabetes mellitus without complication, without long-term current use of insulin (HCC) (Primary)  - POCT glycosylated hemoglobin (Hb A1C) - Microalbumin/Creatinine Ratio, Urine  Follow up:  Follow up in 3 months

## 2023-09-14 ENCOUNTER — Other Ambulatory Visit: Payer: Self-pay | Admitting: Nurse Practitioner

## 2023-09-14 ENCOUNTER — Ambulatory Visit: Payer: Self-pay | Admitting: Nurse Practitioner

## 2023-09-14 LAB — LIPID PANEL
Chol/HDL Ratio: 5.5 {ratio} — ABNORMAL HIGH (ref 0.0–5.0)
Cholesterol, Total: 215 mg/dL — ABNORMAL HIGH (ref 100–199)
HDL: 39 mg/dL — ABNORMAL LOW (ref >39)
LDL Chol Calc (NIH): 144 mg/dL — ABNORMAL HIGH (ref 0–99)
Triglycerides: 176 mg/dL — ABNORMAL HIGH (ref 0–149)
VLDL Cholesterol Cal: 32 mg/dL (ref 5–40)

## 2023-09-14 LAB — COMPREHENSIVE METABOLIC PANEL
ALT: 24 [IU]/L (ref 0–44)
AST: 21 [IU]/L (ref 0–40)
Albumin: 4.1 g/dL (ref 4.1–5.1)
Alkaline Phosphatase: 79 [IU]/L (ref 44–121)
BUN/Creatinine Ratio: 17 (ref 9–20)
BUN: 12 mg/dL (ref 6–24)
Bilirubin Total: 0.3 mg/dL (ref 0.0–1.2)
CO2: 26 mmol/L (ref 20–29)
Calcium: 9.2 mg/dL (ref 8.7–10.2)
Chloride: 100 mmol/L (ref 96–106)
Creatinine, Ser: 0.72 mg/dL — ABNORMAL LOW (ref 0.76–1.27)
Globulin, Total: 2.9 g/dL (ref 1.5–4.5)
Glucose: 203 mg/dL — ABNORMAL HIGH (ref 70–99)
Potassium: 4.3 mmol/L (ref 3.5–5.2)
Sodium: 140 mmol/L (ref 134–144)
Total Protein: 7 g/dL (ref 6.0–8.5)
eGFR: 111 mL/min/{1.73_m2} (ref >59)

## 2023-09-14 LAB — CBC
Hematocrit: 44.6 % (ref 37.5–51.0)
Hemoglobin: 14.7 g/dL (ref 13.0–17.7)
MCH: 28.1 pg (ref 26.6–33.0)
MCHC: 33 g/dL (ref 31.5–35.7)
MCV: 85 fL (ref 79–97)
Platelets: 233 10*3/uL (ref 150–450)
RBC: 5.24 x10E6/uL (ref 4.14–5.80)
RDW: 11.2 % — ABNORMAL LOW (ref 11.6–15.4)
WBC: 7.2 10*3/uL (ref 3.4–10.8)

## 2023-09-14 LAB — MICROALBUMIN / CREATININE URINE RATIO
Creatinine, Urine: 91.2 mg/dL
Microalb/Creat Ratio: 15 mg/g{creat} (ref 0–29)
Microalbumin, Urine: 13.5 ug/mL

## 2023-10-26 ENCOUNTER — Other Ambulatory Visit: Payer: Self-pay

## 2023-10-26 DIAGNOSIS — E119 Type 2 diabetes mellitus without complications: Secondary | ICD-10-CM

## 2023-10-26 MED ORDER — METFORMIN HCL ER 500 MG PO TB24: 500.0000 mg | ORAL_TABLET | Freq: Two times a day (BID) | ORAL | 2 refills | Status: AC

## 2023-12-12 ENCOUNTER — Ambulatory Visit: Payer: Self-pay | Admitting: Nurse Practitioner

## 2023-12-28 ENCOUNTER — Ambulatory Visit: Payer: Self-pay | Admitting: Nurse Practitioner

## 2024-10-17 ENCOUNTER — Encounter: Payer: Self-pay | Admitting: Nurse Practitioner

## 2024-11-21 ENCOUNTER — Ambulatory Visit: Payer: Self-pay | Admitting: Nurse Practitioner
